# Patient Record
Sex: Male | Born: 1955 | Race: White | Hispanic: No | Marital: Single | Smoking: Current every day smoker
Health system: Southern US, Community
[De-identification: ages and names within clinical notes are randomized; demographics above are authoritative.]

## PROBLEM LIST (undated history)

## (undated) DIAGNOSIS — F419 Anxiety disorder, unspecified: Secondary | ICD-10-CM

## (undated) DIAGNOSIS — F32A Depression, unspecified: Secondary | ICD-10-CM

## (undated) DIAGNOSIS — R17 Unspecified jaundice: Secondary | ICD-10-CM

## (undated) DIAGNOSIS — R4182 Altered mental status, unspecified: Secondary | ICD-10-CM

## (undated) DIAGNOSIS — Z8619 Personal history of other infectious and parasitic diseases: Secondary | ICD-10-CM

## (undated) DIAGNOSIS — F329 Major depressive disorder, single episode, unspecified: Secondary | ICD-10-CM

## (undated) HISTORY — PX: TONSILLECTOMY: SUR1361

---

## 2013-01-09 ENCOUNTER — Emergency Department (HOSPITAL_COMMUNITY)
Admission: EM | Admit: 2013-01-09 | Discharge: 2013-01-09 | Disposition: A | Discharge status: Home / Self Care | Payer: Medicaid Other | Attending: Emergency Medicine | Admitting: Emergency Medicine

## 2013-01-09 ENCOUNTER — Emergency Department (HOSPITAL_COMMUNITY): Payer: Medicaid Other

## 2013-01-09 ENCOUNTER — Encounter (HOSPITAL_COMMUNITY): Payer: Self-pay | Admitting: Emergency Medicine

## 2013-01-09 DIAGNOSIS — M2560 Stiffness of unspecified joint, not elsewhere classified: Secondary | ICD-10-CM | POA: Insufficient documentation

## 2013-01-09 DIAGNOSIS — R4182 Altered mental status, unspecified: Secondary | ICD-10-CM | POA: Insufficient documentation

## 2013-01-09 DIAGNOSIS — H938X9 Other specified disorders of ear, unspecified ear: Secondary | ICD-10-CM | POA: Insufficient documentation

## 2013-01-09 LAB — COMPREHENSIVE METABOLIC PANEL
Albumin: 4.1 g/dL (ref 3.5–5.2)
Alkaline Phosphatase: 67 U/L (ref 39–117)
BUN: 3 mg/dL — ABNORMAL LOW (ref 6–23)
Calcium: 9.9 mg/dL (ref 8.4–10.5)
GFR calc Af Amer: >90 mL/min (ref >90)
GFR calc non Af Amer: >90 mL/min (ref >90)
Glucose, Bld: 103 mg/dL — ABNORMAL HIGH (ref 70–99)
Potassium: 4.2 mEq/L (ref 3.5–5.1)
Sodium: 135 mEq/L (ref 135–145)
Total Protein: 7.6 g/dL (ref 6.0–8.3)

## 2013-01-09 LAB — CBC WITH DIFFERENTIAL/PLATELET
Basophils Relative: 1 % (ref 0–1)
Eosinophils Absolute: 0.2 10*3/uL (ref 0.0–0.7)
MCH: 31.9 pg (ref 26.0–34.0)
MCHC: 35.8 g/dL (ref 30.0–36.0)
Monocytes Relative: 5 % (ref 3–12)
Neutro Abs: 8 10*3/uL — ABNORMAL HIGH (ref 1.7–7.7)
Neutrophils Relative %: 71 % (ref 43–77)
Platelets: 298 10*3/uL (ref 150–400)
RDW: 13.1 % (ref 11.5–15.5)

## 2013-01-09 LAB — RAPID URINE DRUG SCREEN, HOSP PERFORMED
Amphetamines: NOT DETECTED
Benzodiazepines: NOT DETECTED
Opiates: NOT DETECTED
Tetrahydrocannabinol: NOT DETECTED

## 2013-01-09 NOTE — ED Notes (Signed)
Per pt's sister she brings him here today by request of pt's court ordered attorney to be evaluated for why pt has been "not right" the past couple of months. 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  Pt not able to remember things and rambles about nonsense and believes taht he has money in banks all over the area.  Pt not able to perform everyday functions living by himself per pt's sister.

## 2013-01-09 NOTE — ED Provider Notes (Signed)
CSN: [id number]     Arrival date & time 01/09/13  1541 History  This chart was scribed for non-physician practitioner, [name], PA-C working with [name] Crease, MD by Greggory Stallion, ED scribe. This patient was seen in room WTR4/WLPT4 and the patient's care was started at 4:42 PM.   Chief Complaint  Patient presents with  . Medical Clearance   The history is provided by the patient. No language interpreter was used.   HPI Comments: [name] [name] is a 57 y.o. male who presents to the Emergency Department for medical clearance. Pt states he needs a medical evaluation before he goes to court for a DWI. Pt does not have medical evaluation papers from the court. He states he is having popping in his ears and some joint stiffness. Denies any other current complaints. His sister states he has had acute change in memory in the last 2 months in which the pt is unable to carry out his daily activities. His house is foreclosed. His sister is concerned and worried that he is not able to care for himself at home. Pt denies SI/HI.  No past medical history on file. No past surgical history on file. No family history on file. History  Substance Use Topics  . Smoking status: Not on file  . Smokeless tobacco: Not on file  . Alcohol Use: Not on file    Review of Systems  Musculoskeletal: Positive for arthralgias (stiffness).  Psychiatric/Behavioral: Negative for suicidal ideas.    Allergies  Review of patient's allergies indicates not on file.  Home Medications  No current outpatient prescriptions on file.  There were no vitals taken for this visit.  Physical Exam  Nursing note and vitals reviewed. Constitutional: He is oriented to person, place, and time. He appears well-developed and well-nourished. No distress.  HENT:  Head: Normocephalic and atraumatic.  Eyes: EOM are normal.  Neck: Neck supple. No tracheal deviation present.  Cardiovascular: Normal rate.   Pulmonary/Chest:  Effort normal. No respiratory distress.  Musculoskeletal: Normal range of motion.  Neurological: He is alert and oriented to person, place, and time.  No focal neuro deficit on exam.   Skin: Skin is warm and dry.  Psychiatric: He has a normal mood and affect. His speech is normal and behavior is normal. Thought content is not paranoid. Cognition and memory are impaired. He expresses no homicidal and no suicidal ideation. He exhibits abnormal recent memory.  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    COORDINATION OF CARE: 4:58 PM-Repeatedly discussed with pt and his sister there is not an acute need for a head CT. After repetitive discussions, will order head CT for pt.   7:04 PM Sister brought pt to ER because he has been acting different from when he was 2 months ago. He is forgetful, repeating sentences and having decline in his memory.  NO prior hx of stroke. Is a social drinker but does not drink or uses rec drug according to sis.  She is traveling from [place] in hope of finding help for him.  He does not have a PCP.  Currently denies SI/HI or hallucination.  He is alert and oriented x 3.  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  Deterioration has been ongoing, nothing acute today.  Doubt acute stroke.  Head CT without acute finding. Since pt is a trucker, i worry of possible syphilis infection causing AMS.  I did offer to provide medical clearance with labs but subsequently pt and sister request to be discharge.  I offer a list of resources and encourage to find primary care for further evaluation.  Pt otherwise stable for discharge.    Labs Review Labs Reviewed  CBC WITH DIFFERENTIAL  COMPREHENSIVE METABOLIC PANEL  URINE RAPID  DRUG SCREEN (HOSP PERFORMED)  ETHANOL  RPR   Imaging Review Ct Head Wo Contrast  01/09/2013   CLINICAL DATA:  Memory loss past 2 months.  EXAM: CT HEAD WITHOUT CONTRAST  TECHNIQUE: Contiguous axial images were obtained from the base of the skull through the vertex without contrast.  COMPARISON:  None  FINDINGS: Premature for age cerebral and cerebellar atrophy. Chronic microvascular ischemic change in the periventricular and subcortical white matter. No evidence for acute infarction, hemorrhage, mass lesion, hydrocephalus, or extra-axial fluid. Calvarium intact. There appears to have been an old inferior blowout fracture of the right orbit which is incompletely evaluated on this exam.  IMPRESSION: No acute intracranial abnormality. Premature for age atrophy and white matter disease. Suspect old orbital trauma on the right.   Electronically Signed   By: Davonna Belling M.D.   On: 01/09/2013 17:34    EKG Interpretation   None       MDM   1. Altered mental status    BP 136/94  Pulse 93  Temp(Src) 98.7 F (37.1 C) (Oral)  Resp 16  SpO2 99%    I personally performed the services described in this documentation, which was scribed in my presence. The recorded information has been reviewed and is accurate.    [name], PA-C 01/09/13 1911

## 2013-01-09 NOTE — ED Provider Notes (Signed)
Medical screening examination/treatment/procedure(s) were performed by non-physician practitioner and as supervising physician I was immediately available for consultation/collaboration.  EKG Interpretation   None         [name], MD 01/09/13 [phone]

## 2013-01-10 LAB — RPR: RPR Ser Ql: NONREACTIVE

## 2013-05-14 ENCOUNTER — Telehealth: Payer: Self-pay | Admitting: *Deleted

## 2013-05-14 NOTE — Telephone Encounter (Signed)
Sister reported that patient has been experiencing Altered Mental Status for the past year. CSW encouraged sister to [facility]. Sister stated that she would and follow up for further care as needed with [facility].  Beverly Sessionsywan J Micah Galeno MSW, LCSW

## 2013-05-18 ENCOUNTER — Emergency Department (HOSPITAL_COMMUNITY): Payer: Medicaid Other

## 2013-05-18 ENCOUNTER — Inpatient Hospital Stay (HOSPITAL_COMMUNITY)
Admission: EM | Admit: 2013-05-18 | Discharge: 2013-05-19 | DRG: 640 | Disposition: A | Discharge status: Skilled Nursing Facility | Payer: Medicaid Other | Attending: Internal Medicine | Admitting: Internal Medicine

## 2013-05-18 DIAGNOSIS — Z79899 Other long term (current) drug therapy: Secondary | ICD-10-CM | POA: Diagnosis not present

## 2013-05-18 DIAGNOSIS — I998 Other disorder of circulatory system: Secondary | ICD-10-CM | POA: Diagnosis not present

## 2013-05-18 DIAGNOSIS — E876 Hypokalemia: Secondary | ICD-10-CM | POA: Diagnosis present

## 2013-05-18 DIAGNOSIS — E871 Hypo-osmolality and hyponatremia: Principal | ICD-10-CM | POA: Diagnosis present

## 2013-05-18 DIAGNOSIS — D72829 Elevated white blood cell count, unspecified: Secondary | ICD-10-CM | POA: Diagnosis present

## 2013-05-18 DIAGNOSIS — F17200 Nicotine dependence, unspecified, uncomplicated: Secondary | ICD-10-CM | POA: Diagnosis present

## 2013-05-18 DIAGNOSIS — I498 Other specified cardiac arrhythmias: Secondary | ICD-10-CM | POA: Diagnosis present

## 2013-05-18 DIAGNOSIS — G9340 Encephalopathy, unspecified: Secondary | ICD-10-CM | POA: Diagnosis present

## 2013-05-18 DIAGNOSIS — R4182 Altered mental status, unspecified: Secondary | ICD-10-CM

## 2013-05-18 DIAGNOSIS — R579 Shock, unspecified: Secondary | ICD-10-CM | POA: Diagnosis present

## 2013-05-18 DIAGNOSIS — I808 Phlebitis and thrombophlebitis of other sites: Secondary | ICD-10-CM | POA: Diagnosis not present

## 2013-05-18 DIAGNOSIS — Y849 Medical procedure, unspecified as the cause of abnormal reaction of the patient, or of later complication, without mention of misadventure at the time of the procedure: Secondary | ICD-10-CM | POA: Diagnosis not present

## 2013-05-18 DIAGNOSIS — M6282 Rhabdomyolysis: Secondary | ICD-10-CM | POA: Diagnosis present

## 2013-05-18 LAB — CBC
HCT: 36.6 % — ABNORMAL LOW (ref 39.0–52.0)
Hemoglobin: 14 g/dL (ref 13.0–17.0)
MCH: 31.4 pg (ref 26.0–34.0)
MCHC: 38.3 g/dL — ABNORMAL HIGH (ref 30.0–36.0)
MCV: 82.1 fL (ref 78.0–100.0)
Platelets: 361 10*3/uL (ref 150–400)
RBC: 4.46 MIL/uL (ref 4.22–5.81)
RDW: 12.2 % (ref 11.5–15.5)
WBC: 17.2 10*3/uL — ABNORMAL HIGH (ref 4.0–10.5)

## 2013-05-18 LAB — COMPREHENSIVE METABOLIC PANEL
ALBUMIN: 3.4 g/dL — AB (ref 3.5–5.2)
ALT: 18 U/L (ref 0–53)
AST: 59 U/L — ABNORMAL HIGH (ref 0–37)
Alkaline Phosphatase: 66 U/L (ref 39–117)
BUN: 3 mg/dL — ABNORMAL LOW (ref 6–23)
CALCIUM: 8.7 mg/dL (ref 8.4–10.5)
CO2: 22 mEq/L (ref 19–32)
CREATININE: 0.48 mg/dL — AB (ref 0.50–1.35)
Chloride: 72 mEq/L — ABNORMAL LOW (ref 96–112)
GFR calc Af Amer: >90 mL/min (ref >90)
GFR calc non Af Amer: >90 mL/min (ref >90)
Glucose, Bld: 84 mg/dL (ref 70–99)
Potassium: 3.9 mEq/L (ref 3.7–5.3)
Sodium: 108 mEq/L — CL (ref 137–147)
Total Bilirubin: 0.8 mg/dL (ref 0.3–1.2)
Total Protein: 6.4 g/dL (ref 6.0–8.3)

## 2013-05-18 LAB — CBG MONITORING, ED: Glucose-Capillary: 103 mg/dL — ABNORMAL HIGH (ref 70–99)

## 2013-05-18 LAB — BASIC METABOLIC PANEL WITH GFR
BUN: 3 mg/dL — ABNORMAL LOW (ref 6–23)
CO2: 22 meq/L (ref 19–32)
Calcium: 8.6 mg/dL (ref 8.4–10.5)
Chloride: 83 meq/L — ABNORMAL LOW (ref 96–112)
Creatinine, Ser: 0.53 mg/dL (ref 0.50–1.35)
GFR calc Af Amer: >90 mL/min
GFR calc non Af Amer: >90 mL/min
Glucose, Bld: 93 mg/dL (ref 70–99)
Potassium: 4.2 meq/L (ref 3.7–5.3)
Sodium: 119 meq/L — CL (ref 137–147)

## 2013-05-18 LAB — URINALYSIS, ROUTINE W REFLEX MICROSCOPIC
Bilirubin Urine: NEGATIVE
GLUCOSE, UA: NEGATIVE mg/dL
HGB URINE DIPSTICK: NEGATIVE
Ketones, ur: NEGATIVE mg/dL
Leukocytes, UA: NEGATIVE
Nitrite: NEGATIVE
Protein, ur: NEGATIVE mg/dL
Specific Gravity, Urine: 1.004 — ABNORMAL LOW (ref 1.005–1.030)
Urobilinogen, UA: 0.2 mg/dL (ref 0.0–1.0)
pH: 7 (ref 5.0–8.0)

## 2013-05-18 LAB — GLUCOSE, CAPILLARY: Glucose-Capillary: 100 mg/dL — ABNORMAL HIGH (ref 70–99)

## 2013-05-18 LAB — SODIUM, URINE, RANDOM: Sodium, Ur: <20 mEq/L

## 2013-05-18 LAB — BASIC METABOLIC PANEL
BUN: 3 mg/dL — ABNORMAL LOW (ref 6–23)
CO2: 25 meq/L (ref 19–32)
CREATININE: 0.54 mg/dL (ref 0.50–1.35)
Calcium: 9.2 mg/dL (ref 8.4–10.5)
Chloride: 73 mEq/L — ABNORMAL LOW (ref 96–112)
GFR calc Af Amer: >90 mL/min (ref >90)
GLUCOSE: 99 mg/dL (ref 70–99)
Potassium: 4.3 mEq/L (ref 3.7–5.3)
Sodium: 113 mEq/L — CL (ref 137–147)

## 2013-05-18 LAB — RPR

## 2013-05-18 LAB — RAPID URINE DRUG SCREEN, HOSP PERFORMED
Amphetamines: NOT DETECTED
BENZODIAZEPINES: POSITIVE — AB
Barbiturates: NOT DETECTED
COCAINE: NOT DETECTED
Opiates: NOT DETECTED
TETRAHYDROCANNABINOL: NOT DETECTED

## 2013-05-18 LAB — PHOSPHORUS: PHOSPHORUS: 2.7 mg/dL (ref 2.3–4.6)

## 2013-05-18 LAB — MAGNESIUM: MAGNESIUM: 1.5 mg/dL (ref 1.5–2.5)

## 2013-05-18 LAB — PROCALCITONIN: Procalcitonin: <0.1 ng/mL

## 2013-05-18 LAB — TSH: TSH: 0.934 u[IU]/mL (ref 0.350–4.500)

## 2013-05-18 LAB — LACTIC ACID, PLASMA
LACTIC ACID, VENOUS: 4.3 mmol/L — AB (ref 0.5–2.2)
LACTIC ACID, VENOUS: 2.1 mmol/L (ref 0.5–2.2)

## 2013-05-18 LAB — CK: CK TOTAL: 2052 U/L — AB (ref 7–232)

## 2013-05-18 LAB — ETHANOL: Alcohol, Ethyl (B): <11 mg/dL (ref 0–11)

## 2013-05-18 LAB — TROPONIN I: Troponin I: <0.3 ng/mL (ref <0.30)

## 2013-05-18 LAB — OSMOLALITY, URINE: Osmolality, Ur: 30 mOsm/kg — ABNORMAL LOW (ref 390–1090)

## 2013-05-18 MED ORDER — SODIUM CHLORIDE 0.9 % IV SOLN
250.0000 mL | INTRAVENOUS | Status: DC | PRN
Start: 2013-05-18 — End: 2013-05-19

## 2013-05-18 MED ORDER — SODIUM CHLORIDE 0.9 % IV BOLUS (SEPSIS)
1000.0000 mL | Freq: Once | INTRAVENOUS | Status: DC
  Administered 2013-05-18: 1000 mL via INTRAVENOUS

## 2013-05-18 MED ORDER — TETANUS-DIPHTH-ACELL PERTUSSIS 5-2.5-18.5 LF-MCG/0.5 IM SUSP
0.5000 mL | Freq: Once | INTRAMUSCULAR | Status: DC
  Administered 2013-05-18: 0.5 mL via INTRAMUSCULAR
  Filled 2013-05-18: qty 0.5

## 2013-05-18 MED ORDER — DEXTROSE 5 % IV SOLN
INTRAVENOUS | Status: DC
  Administered 2013-05-18: 1000 mL via INTRAVENOUS

## 2013-05-18 MED ORDER — THIAMINE HCL 100 MG/ML IJ SOLN
100.0000 mg | Freq: Once | INTRAMUSCULAR | Status: DC
  Administered 2013-05-18: 100 mg via INTRAVENOUS
  Filled 2013-05-18: qty 2

## 2013-05-18 MED ORDER — ASPIRIN 81 MG PO CHEW: 324.0000 mg | CHEWABLE_TABLET | ORAL | Status: DC

## 2013-05-18 MED ORDER — LORAZEPAM 2 MG/ML IJ SOLN
0.5000 mg | INTRAMUSCULAR | Status: DC | PRN
  Administered 2013-05-18 – 2013-05-19 (×2): 2 mg via INTRAVENOUS
  Filled 2013-05-18: qty 1

## 2013-05-18 MED ORDER — ASPIRIN 300 MG RE SUPP
300.0000 mg | RECTAL | Status: DC
  Administered 2013-05-18: 300 mg via RECTAL
  Filled 2013-05-18: qty 1

## 2013-05-18 MED ORDER — LORAZEPAM 2 MG/ML IJ SOLN
INTRAMUSCULAR | Status: AC
  Administered 2013-05-18: 2 mg via INTRAVENOUS
  Filled 2013-05-18: qty 1

## 2013-05-18 MED ORDER — PHENYLEPHRINE HCL 10 MG/ML IJ SOLN
30.0000 ug/min | INTRAVENOUS | Status: DC
  Administered 2013-05-18: 30 ug/min via INTRAVENOUS
  Administered 2013-05-19: 70 ug/min via INTRAVENOUS
  Administered 2013-05-19: 50 ug/min via INTRAVENOUS
  Filled 2013-05-18 (×3): qty 1

## 2013-05-18 MED ORDER — ENOXAPARIN SODIUM 40 MG/0.4ML ~~LOC~~ SOLN
40.0000 mg | SUBCUTANEOUS | Status: DC
  Administered 2013-05-18: 40 mg via SUBCUTANEOUS
  Filled 2013-05-18 (×2): qty 0.4

## 2013-05-18 MED ORDER — SODIUM CHLORIDE 0.9 % IV SOLN: INTRAVENOUS | Status: DC

## 2013-05-18 MED ORDER — SODIUM CHLORIDE 0.9 % IV SOLN
INTRAVENOUS | Status: DC
  Administered 2013-05-18: 19:00:00 via INTRAVENOUS

## 2013-05-18 MED ORDER — DEXMEDETOMIDINE HCL IN NACL 200 MCG/50ML IV SOLN
0.4000 ug/kg/h | INTRAVENOUS | Status: DC
  Administered 2013-05-18: 1.2 ug/kg/h via INTRAVENOUS
  Administered 2013-05-18: 0.6 ug/kg/h via INTRAVENOUS
  Administered 2013-05-18: 0.8 ug/kg/h via INTRAVENOUS
  Administered 2013-05-19: 1.2 ug/kg/h via INTRAVENOUS
  Administered 2013-05-19: 0.4 ug/kg/h via INTRAVENOUS
  Filled 2013-05-18 (×4): qty 50

## 2013-05-18 NOTE — ED Provider Notes (Addendum)
CSN: [id number]     Arrival date & time 05/18/13  1408 History   First MD Initiated Contact with Patient 05/18/13 1409     Chief Complaint  Patient presents with  . Altered Mental Status     (Consider location/radiation/quality/duration/timing/severity/associated sxs/prior Treatment) Patient is a 58 y.o. male presenting with altered mental status. The history is provided by the patient and the EMS personnel. The history is limited by the condition of the patient.  Altered Mental Status pt w unknown hx found lying in yard today by mail carrier.  Pt noted w incomprehensible speech and altered mental status, EMS was called.  EMS placed on back board and ccollar precautions, at which point pt became agitated, combative, thrashing about, moving bil extremities.  EMS indicates restrained as best they could and gave patient a total of 7 mg versed in route to ED.  Pt arrives to ED w markedly altered mental status, sedated - level 5 caveat.      Past Medical History  Diagnosis Date  . Pneumothorax 2002; 05/10/2013    left; right ("both required chest tubes")  . GERD (gastroesophageal reflux disease)   . [id number](478.2)     "couple times/month" (05/11/2013)   Past Surgical History  Procedure Laterality Date  . Tonsillectomy      "when I was a kid; told they grew back"  . Inguinal hernia repair Bilateral 1990's  . Wrist fracture surgery Left 1980's   No family history on file. History  Substance Use Topics  . Smoking status: Current Every Day Smoker -- 2.00 packs/day for 33 years    Types: Cigarettes  . Smokeless tobacco: Former Neurosurgeon    Types: Chew     Comment: "chewed in my teens"  . Alcohol Use: 7.2 oz/week    12 Shots of liquor per week     Comment: 05/11/2013 "1-2 mixed drinkscouple nights/week; 2-4 mixed drinks on the weekend days"    Review of Systems  Unable to perform ROS: Mental status change  level 5 caveat - altered mental status      Allergies  Review of patient's  allergies indicates no known allergies.  Home Medications   Prior to Admission medications   Medication Sig Start Date End Date Taking? Authorizing Provider  Aspirin-Salicylamide-Caffeine (BC HEADACHE PO) Take 1 packet by mouth as needed (pain / headache).   Yes Historical Provider, MD  benzonatate (TESSALON) 100 MG capsule Take 100 mg by mouth every 8 (eight) hours as needed for cough.   Yes Historical Provider, MD  levofloxacin (LEVAQUIN) 500 MG tablet Take 500 mg by mouth daily. Take for 10 days patient was unable to tell me when he started medication. Only 3 pills left in bottle as of 05-18-13.   Yes Historical Provider, MD   BP 129/50  Pulse 69  Temp(Src) 99.2 F (37.3 C) (Axillary)  Resp 25  SpO2 99% Physical Exam  Nursing note and vitals reviewed. Constitutional:  Pt sedated/obtunded. Disheveled. Not verbally responsive. Multiple abrasions to extremities.   HENT:  Mouth/Throat: Oropharynx is clear and moist.  Eyes: Conjunctivae are normal. Pupils are equal, round, and reactive to light. No scleral icterus.  Neck: Neck supple. No tracheal deviation present. No thyromegaly present.  No bruit.  Ccollar precautions.     Cardiovascular: Normal rate, regular rhythm, normal heart sounds and intact distal pulses.  Exam reveals no gallop and no friction rub.   No murmur heard. Pulmonary/Chest: Effort normal and breath sounds normal. No accessory muscle usage. No  respiratory distress.  Abdominal: Soft. Bowel sounds are normal. He exhibits no distension and no mass. There is no tenderness. There is no rebound and no guarding.  Genitourinary:  No cva tenderness. Normal ext gen.   Musculoskeletal: He exhibits no edema.  Good rom bil ext without apparent pain, no focal deformity noted.  Abrasions bil knees and hands. Distal pulses palp.  CTLS spine, non tender, aligned, no step off.   Neurological:  Pt opens eyes to command. Per ems, pt making purposefully movement bil extremities pta.   Does not follow commands. Makes incomprehensible sounds.   Skin: Skin is warm and dry. No rash noted.  Psychiatric:  Pt w markedly altered mental status, sedated.     ED Course  Procedures (including critical care time)   Results for orders placed during the hospital encounter of 05/18/13  CBC      Result Value Ref Range   WBC 17.2 (*) 4.0 - 10.5 K/uL   RBC 4.46  4.22 - 5.81 MIL/uL   Hemoglobin 14.0  13.0 - 17.0 g/dL   HCT 16.136.6 (*) 09.639.0 - 04.552.0 %   MCV 82.1  78.0 - 100.0 fL   MCH 31.4  26.0 - 34.0 pg   MCHC 38.3 (*) 30.0 - 36.0 g/dL   RDW 40.912.2  81.111.5 - 91.415.5 %   Platelets 361  150 - 400 K/uL  COMPREHENSIVE METABOLIC PANEL      Result Value Ref Range   Sodium 108 (*) 137 - 147 mEq/L   Potassium 3.9  3.7 - 5.3 mEq/L   Chloride 72 (*) 96 - 112 mEq/L   CO2 22  19 - 32 mEq/L   Glucose, Bld 84  70 - 99 mg/dL   BUN 3 (*) 6 - 23 mg/dL   Creatinine, Ser 7.820.48 (*) 0.50 - 1.35 mg/dL   Calcium 8.7  8.4 - 95.610.5 mg/dL   Total Protein 6.4  6.0 - 8.3 g/dL   Albumin 3.4 (*) 3.5 - 5.2 g/dL   AST 59 (*) 0 - 37 U/L   ALT 18  0 - 53 U/L   Alkaline Phosphatase 66  39 - 117 U/L   Total Bilirubin 0.8  0.3 - 1.2 mg/dL   GFR calc non Af Amer >90  >90 mL/min   GFR calc Af Amer >90  >90 mL/min  ETHANOL      Result Value Ref Range   Alcohol, Ethyl (B) <11  0 - 11 mg/dL  TROPONIN I      Result Value Ref Range   Troponin I <0.30  <0.30 ng/mL  CK      Result Value Ref Range   Total CK 2052 (*) 7 - 232 U/L  CBG MONITORING, ED      Result Value Ref Range   Glucose-Capillary 103 (*) 70 - 99 mg/dL   Dg Chest 1 View  2/13/08654/20/2015   CLINICAL DATA:  Altered mental status.  Unresponsive patient.  EXAM: CHEST - 1 VIEW  COMPARISON:  DG CHEST 2 VIEW dated 05/13/2013  FINDINGS: Inferior half of the chest is excluded from view. The patient was noncompliant with imaging.  The cardiopericardial silhouette and mediastinal contours appear within normal limits. No airspace disease or effusion. No pneumothorax  identified. Monitoring leads project over the chest.  IMPRESSION: 1. No visible acute cardiopulmonary disease. 2. Lung bases are cut off due to patient non compliance with imaging.   Electronically Signed   By: Andreas NewportGeoffrey  Lamke M.D.   On: 05/18/2013  15:15   Dg Chest 2 View  05/13/2013   CLINICAL DATA:  Right chest pain  EXAM: CHEST  2 VIEW  COMPARISON:  05/12/2013  FINDINGS: Cardiac shadow is stable. The lungs are well-aerated without evidence of recurrent pneumothorax. Mild left basilar atelectasis is seen. No acute bony abnormality is noted.  IMPRESSION: Left basilar changes  No recurrent pneumothorax.   Electronically Signed   By: Alcide CleverMark  Lukens M.D.   On: 05/13/2013 07:59   Dg Chest Port 1 View  05/12/2013   CLINICAL DATA:  Postop.  Check chest tubes.  EXAM: PORTABLE CHEST - 1 VIEW  COMPARISON:  05/12/2013  FINDINGS: Right-sided chest tube has been removed. No pneumothorax identified. There is bibasilar atelectasis. Mild perihilar atelectasis. Heart size is normal.  IMPRESSION: No pneumothorax following chest tube removal.   Electronically Signed   By: Rosalie GumsBeth  Brown M.D.   On: 05/12/2013 15:39   Dg Chest Port 1 View  05/12/2013   CLINICAL DATA:  Check chest tube position  EXAM: PORTABLE CHEST - 1 VIEW  COMPARISON:  05/11/2013  FINDINGS: Cardiac shadow is stable. The lungs remain well aerated. No pneumothorax is identified. A right-sided chest tube is again seen and has withdrawn slightly in the interval although the and hole and proximal side hole lie within the chest cavity. No new focal abnormality is seen.  IMPRESSION: Slight withdrawal of right-sided chest tube although no significant interval change is noted.   Electronically Signed   By: Alcide CleverMark  Lukens M.D.   On: 05/12/2013 07:38   Portable Chest 1 View  05/11/2013   CLINICAL DATA:  Followup chest tube.  Chest discomfort.  EXAM: PORTABLE CHEST - 1 VIEW  COMPARISON:  05/10/2013  FINDINGS: Cardiac silhouette is normal size. Normal mediastinal and hilar  contours.  There is bilateral irregular interstitial thickening that is most evident in the left lung base. No areas of consolidation.  Right chest tube is stable. No pneumothorax. There is no convincing pleural effusion.  IMPRESSION: 1. No convincing residual pneumothorax.  Stable right chest tube. 2. Irregularly thickened interstitial markings, without significant change allowing for differences in lung volume and technique. No convincing edema. There is no focal consolidation.   Electronically Signed   By: Amie Portlandavid  Ormond M.D.   On: 05/11/2013 08:10   Dg Chest Port 1 View  05/10/2013   CLINICAL DATA:  Postop, check chest tube  EXAM: PORTABLE CHEST - 1 VIEW  COMPARISON:  DG CHEST 1V PORT dated 05/10/2013; DG CHEST 2 VIEW dated 03/31/2009  FINDINGS: Right chest tube has been placed. There is significant improvement with only a trace 3 mm residual pneumothorax. Heart size is normal. Lungs are clear.  IMPRESSION: Only trace residual pneumothorax identified on the right status post chest tube placement.   Electronically Signed   By: Esperanza Heiraymond  Rubner M.D.   On: 05/10/2013 20:22   Dg Chest Portable 1 View  05/10/2013   CLINICAL DATA:  Shortness of breath today  EXAM: PORTABLE CHEST - 1 VIEW  COMPARISON:  DG CHEST 2 VIEW dated 03/31/2009  FINDINGS: There is a large right-sided pneumothorax. The degree of collapse is nearly 80%. There is no significant mediastinal shift. The left lung is well expanded. The cardiac silhouette is normal in size. There is no pleural effusion. The observed portions of the bony thorax appear normal.  IMPRESSION: There is a large right-sided pneumothorax without significant mediastinal shift. These results were called by telephone at the time of interpretation on 05/10/2013 at 6:32 PM  to Dr. Nelva Nay , who verbally acknowledged these results.   Electronically Signed   By: David  [place]   On: 05/10/2013 18:32       EKG Interpretation   Date/Time:  Monday May 18 2013 14:13:53  EDT Ventricular Rate:  66 PR Interval:  153 QRS Duration: 99 QT Interval:  526 QTC Calculation: 551 R Axis:   83 Text Interpretation:  Sinus rhythm ST elev, probable normal early repol  pattern Prolonged QT interval Baseline wander in lead(s) V5 No previous  tracing Confirmed by Denton Lank  MD, Caryn Bee ([id number]) on 05/18/2013 2:32:06 PM      MDM  Iv ns. Continuous pulse ox and monitor.   Labs. Reviewed nursing notes and prior charts for additional history.   Cxr. Cts.   Na+ returns very low, NS bolus, BMET redrawn to verify.  Med service contacted for admission.  Recheck pt, more responsive/alert than prior, still not verbally responsive/answering questions, is utterly few word sentences, v difficult to understand. Pt moving bil extremities purposefully. Clears throat//swallows/protecting airway. No resp depression.  cts pending.   Contacted [facility]ad hospitalist for admission, discussed pt with Dr Waymon Amato - he requests waiting for repeat BMET, and if Na+ 108 requests admit to ICU.    1630 repeat Na+/BMET pending, UA pending, CTs pending - signed out to Dr Patria Mane to check these results, reassess patient and dispo appropriately.   CRITICAL CARE RE markedly altered mental status, severe hyponatremia Performed by: Suzi Roots Total critical care time: 45 Critical care time was exclusive of separately billable procedures and treating other patients. Critical care was necessary to treat or prevent imminent or life-threatening deterioration. Critical care was time spent personally by me on the following activities: development of treatment plan with patient and/or surrogate as well as nursing, discussions with consultants, evaluation of patient's response to treatment, examination of patient, obtaining history from patient or surrogate, ordering and performing treatments and interventions, ordering and review of laboratory studies, ordering and review of radiographic studies, pulse oximetry and  re-evaluation of patient's condition.       Suzi Roots, MD 05/18/13 1646  Suzi Roots, MD 07/17/13 [phone]

## 2013-05-18 NOTE — ED Provider Notes (Addendum)
Repeat sodium still low on 13.  Continue IV hydration.  Will admit to the intensive care unit.  Continues to be significantly altered.  Oral temperature is 99.2.  Will obtain a rectal temperature.  Blood cultures added now.  Lactate ordered now.  I suppose the tourniquet had a seizure from his hyponatremia and when EMS found him and he was combative this could have been a postictal state.  The patient received Versed.  His had no seizures in the emergency department.  Patient be admitted to the intensive care unit.  I discussed the case with Dr. Patrick Wright of [facility]  CRITICAL CAREShan Levans Performed by: Lyanne CoKevin M Jennel Mara Total critical care time: 32 Critical care time was exclusive of separately billable procedures and treating other patients. Critical care was necessary to treat or prevent imminent or life-threatening deterioration. Critical care was time spent personally by me on the following activities: development of treatment plan with patient and/or surrogate as well as nursing, discussions with consultants, evaluation of patient's response to treatment, examination of patient, obtaining history from patient or surrogate, ordering and performing treatments and interventions, ordering and review of laboratory studies, ordering and review of radiographic studies, pulse oximetry and re-evaluation of patient's condition.   Dg Chest 1 View  05/18/2013   CLINICAL DATA:  Altered mental status.  Unresponsive patient.  EXAM: CHEST - 1 VIEW  COMPARISON:  DG CHEST 2 VIEW dated 05/13/2013  FINDINGS: Inferior half of the chest is excluded from view. The patient was noncompliant with imaging.  The cardiopericardial silhouette and mediastinal contours appear within normal limits. No airspace disease or effusion. No pneumothorax identified. Monitoring leads project over the chest.  IMPRESSION: 1. No visible acute cardiopulmonary disease. 2. Lung bases are cut off due to patient non compliance with imaging.   Electronically  Signed   By: Andreas NewportGeoffrey  Lamke M.D.   On: 05/18/2013 15:15   Dg Chest 2 View  05/13/2013   CLINICAL DATA:  Right chest pain  EXAM: CHEST  2 VIEW  COMPARISON:  05/12/2013  FINDINGS: Cardiac shadow is stable. The lungs are well-aerated without evidence of recurrent pneumothorax. Mild left basilar atelectasis is seen. No acute bony abnormality is noted.  IMPRESSION: Left basilar changes  No recurrent pneumothorax.   Electronically Signed   By: Alcide CleverMark  Lukens M.D.   On: 05/13/2013 07:59   Ct Head Wo Contrast  05/18/2013   CLINICAL DATA:  Fall, with pain and injury.  Neck pain.  Head pain.  EXAM: CT HEAD WITHOUT CONTRAST  CT CERVICAL SPINE WITHOUT CONTRAST  TECHNIQUE: Multidetector CT imaging of the head and cervical spine was performed following the standard protocol without intravenous contrast. Multiplanar CT image reconstructions of the cervical spine were also generated.  COMPARISON:  None.  FINDINGS: CT HEAD FINDINGS  No evidence for acute infarction, hemorrhage, mass lesion, hydrocephalus, or extra-axial fluid. There is mild atrophy. Mild chronic microvascular ischemic change is noted. Mild carotid siphon vascular calcification is noted. Calvarium is intact. There is no acute sinus or mastoid disease.  CT CERVICAL SPINE FINDINGS  There is no visible cervical spine fracture, traumatic subluxation, prevertebral soft tissue swelling, or intraspinal hematoma. Advanced disc space narrowing at C5-6 and C6-7 with mild straightening of the cervical lordosis. Multilevel facet arthropathy. Bilateral foraminal narrowing at C5-6 and C6-7. Lung apices show mild scarring. Airway midline. Carotid atherosclerosis.  IMPRESSION: Mild atrophy and small vessel disease. No acute intracranial findings.  Cervical spondylosis.  No fracture or traumatic subluxation.   Electronically  Signed   By: Davonna Belling M.D.   On: 05/18/2013 16:36   Ct Cervical Spine Wo Contrast  05/18/2013   CLINICAL DATA:  Fall, with pain and injury.  Neck  pain.  Head pain.  EXAM: CT HEAD WITHOUT CONTRAST  CT CERVICAL SPINE WITHOUT CONTRAST  TECHNIQUE: Multidetector CT imaging of the head and cervical spine was performed following the standard protocol without intravenous contrast. Multiplanar CT image reconstructions of the cervical spine were also generated.  COMPARISON:  None.  FINDINGS: CT HEAD FINDINGS  No evidence for acute infarction, hemorrhage, mass lesion, hydrocephalus, or extra-axial fluid. There is mild atrophy. Mild chronic microvascular ischemic change is noted. Mild carotid siphon vascular calcification is noted. Calvarium is intact. There is no acute sinus or mastoid disease.  CT CERVICAL SPINE FINDINGS  There is no visible cervical spine fracture, traumatic subluxation, prevertebral soft tissue swelling, or intraspinal hematoma. Advanced disc space narrowing at C5-6 and C6-7 with mild straightening of the cervical lordosis. Multilevel facet arthropathy. Bilateral foraminal narrowing at C5-6 and C6-7. Lung apices show mild scarring. Airway midline. Carotid atherosclerosis.  IMPRESSION: Mild atrophy and small vessel disease. No acute intracranial findings.  Cervical spondylosis.  No fracture or traumatic subluxation.   Electronically Signed   By: Davonna Belling M.D.   On: 05/18/2013 16:36   Dg Chest Port 1 View  05/12/2013   CLINICAL DATA:  Postop.  Check chest tubes.  EXAM: PORTABLE CHEST - 1 VIEW  COMPARISON:  05/12/2013  FINDINGS: Right-sided chest tube has been removed. No pneumothorax identified. There is bibasilar atelectasis. Mild perihilar atelectasis. Heart size is normal.  IMPRESSION: No pneumothorax following chest tube removal.   Electronically Signed   By: Rosalie Gums M.D.   On: 05/12/2013 15:39   Dg Chest Port 1 View  05/12/2013   CLINICAL DATA:  Check chest tube position  EXAM: PORTABLE CHEST - 1 VIEW  COMPARISON:  05/11/2013  FINDINGS: Cardiac shadow is stable. The lungs remain well aerated. No pneumothorax is identified. A  right-sided chest tube is again seen and has withdrawn slightly in the interval although the and hole and proximal side hole lie within the chest cavity. No new focal abnormality is seen.  IMPRESSION: Slight withdrawal of right-sided chest tube although no significant interval change is noted.   Electronically Signed   By: Alcide Clever M.D.   On: 05/12/2013 07:38   Portable Chest 1 View  05/11/2013   CLINICAL DATA:  Followup chest tube.  Chest discomfort.  EXAM: PORTABLE CHEST - 1 VIEW  COMPARISON:  05/10/2013  FINDINGS: Cardiac silhouette is normal size. Normal mediastinal and hilar contours.  There is bilateral irregular interstitial thickening that is most evident in the left lung base. No areas of consolidation.  Right chest tube is stable. No pneumothorax. There is no convincing pleural effusion.  IMPRESSION: 1. No convincing residual pneumothorax.  Stable right chest tube. 2. Irregularly thickened interstitial markings, without significant change allowing for differences in lung volume and technique. No convincing edema. There is no focal consolidation.   Electronically Signed   By: Amie Portland M.D.   On: 05/11/2013 08:10   Dg Chest Port 1 View  05/10/2013   CLINICAL DATA:  Postop, check chest tube  EXAM: PORTABLE CHEST - 1 VIEW  COMPARISON:  DG CHEST 1V PORT dated 05/10/2013; DG CHEST 2 VIEW dated 03/31/2009  FINDINGS: Right chest tube has been placed. There is significant improvement with only a trace 3 mm residual pneumothorax. Heart  size is normal. Lungs are clear.  IMPRESSION: Only trace residual pneumothorax identified on the right status post chest tube placement.   Electronically Signed   By: Esperanza Heir M.D.   On: 05/10/2013 20:22   Dg Chest Portable 1 View  05/10/2013   CLINICAL DATA:  Shortness of breath today  EXAM: PORTABLE CHEST - 1 VIEW  COMPARISON:  DG CHEST 2 VIEW dated 03/31/2009  FINDINGS: There is a large right-sided pneumothorax. The degree of collapse is nearly 80%. There is no  significant mediastinal shift. The left lung is well expanded. The cardiac silhouette is normal in size. There is no pleural effusion. The observed portions of the bony thorax appear normal.  IMPRESSION: There is a large right-sided pneumothorax without significant mediastinal shift. These results were called by telephone at the time of interpretation on 05/10/2013 at 6:32 PM to Dr. Nelva Nay , who verbally acknowledged these results.   Electronically Signed   By: David  [place]   On: 05/10/2013 18:32  I personally reviewed the imaging tests through PACS system I reviewed available ER/hospitalization records through the EMR Results for orders placed during the hospital encounter of 05/18/13  CBC      Result Value Ref Range   WBC 17.2 (*) 4.0 - 10.5 K/uL   RBC 4.46  4.22 - 5.81 MIL/uL   Hemoglobin 14.0  13.0 - 17.0 g/dL   HCT 29.5 (*) 62.1 - 30.8 %   MCV 82.1  78.0 - 100.0 fL   MCH 31.4  26.0 - 34.0 pg   MCHC 38.3 (*) 30.0 - 36.0 g/dL   RDW 65.7  84.6 - 96.2 %   Platelets 361  150 - 400 K/uL  COMPREHENSIVE METABOLIC PANEL      Result Value Ref Range   Sodium 108 (*) 137 - 147 mEq/L   Potassium 3.9  3.7 - 5.3 mEq/L   Chloride 72 (*) 96 - 112 mEq/L   CO2 22  19 - 32 mEq/L   Glucose, Bld 84  70 - 99 mg/dL   BUN 3 (*) 6 - 23 mg/dL   Creatinine, Ser 9.52 (*) 0.50 - 1.35 mg/dL   Calcium 8.7  8.4 - 84.1 mg/dL   Total Protein 6.4  6.0 - 8.3 g/dL   Albumin 3.4 (*) 3.5 - 5.2 g/dL   AST 59 (*) 0 - 37 U/L   ALT 18  0 - 53 U/L   Alkaline Phosphatase 66  39 - 117 U/L   Total Bilirubin 0.8  0.3 - 1.2 mg/dL   GFR calc non Af Amer >90  >90 mL/min   GFR calc Af Amer >90  >90 mL/min  ETHANOL      Result Value Ref Range   Alcohol, Ethyl (B) <11  0 - 11 mg/dL  TROPONIN I      Result Value Ref Range   Troponin I <0.30  <0.30 ng/mL  CK      Result Value Ref Range   Total CK 2052 (*) 7 - 232 U/L  BASIC METABOLIC PANEL      Result Value Ref Range   Sodium 113 (*) 137 - 147 mEq/L   Potassium 4.3   3.7 - 5.3 mEq/L   Chloride 73 (*) 96 - 112 mEq/L   CO2 25  19 - 32 mEq/L   Glucose, Bld 99  70 - 99 mg/dL   BUN 3 (*) 6 - 23 mg/dL   Creatinine, Ser 3.24  0.50 - 1.35 mg/dL  Calcium 9.2  8.4 - 10.5 mg/dL   GFR calc non Af Amer >90  >90 mL/min   GFR calc Af Amer >90  >90 mL/min  CBG MONITORING, ED      Result Value Ref Range   Glucose-Capillary 103 (*) 70 - 99 mg/dL     Lyanne CoKevin M Cearra Portnoy, MD 05/18/13 1700  Lyanne CoKevin M Dalanie Kisner, MD 05/18/13 1705

## 2013-05-18 NOTE — ED Notes (Signed)
Patient to radiology at this time.
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
## 2013-05-18 NOTE — Progress Notes (Addendum)
CCM Interim Progress Note.  Critical value of Na 119 received.  Na 108 --> 119 over the last 7h -- rapid overcorrection  Plan:  - D/c NS and start D5W at 50cc/h.  Cont BMET q2h - Discussed with attending, Dr. Marchelle Gearingamaswamy.  Stacy GardnerNeema Edwar Coe, MD (PGY 3, IMTS)  ADDENDUM:  Na 108 --> 113 --> 119 --> 124 (Na increased 16 over 12 hours -- rapid overcorrection)  Per UpToDate, to relower serum sodium: D5W at 716mL/kg over 2 hours (this will be ~250cc/h for this patient) --> this should correct Na by ~322mEq  To note, serum Na should be lowered at an avg of 421mEq/h, and D5W + Desmopressin should be given to lower the sodium to less than 117 meq/L (108+9) within the next 8 hours, so that the net rate of correction over the 24-hour period is less than 9 meq/L.  PLAN: -250cc/h x 2hours then repeat BMET -if Na is 122, then repeat 250cc/h x 2h and repeat BMET, goal Na=117 by 2pm -if no response or worsening at 5am bmet, will need to treat with desmopressin 2mcg (may increase to 4mcg if no response) IV q6 h - continuing to check frequent BMET q2h

## 2013-05-18 NOTE — Progress Notes (Signed)
CRITICAL VALUE ALERT  Critical value received:  Na 119  Date of notification:  04/20  Time of notification:  2247  Critical value read back:yes  Nurse who received alert:  A.Canary BrimSperry RN  MD notified (1st page):  Neema MD  Time of first page:  2247  MD notified (2nd page):  Time of second page:  Responding MD:  Anselm LisNeema MD  Time MD responded:  [phone]

## 2013-05-18 NOTE — ED Notes (Signed)
Patient found lying in yard today by mail carrier, patient with incomprehensible speech per EMS and patient became combative with EMS crew during transport, patient received Versed 7 mg total per EMS pta,

## 2013-05-19 ENCOUNTER — Inpatient Hospital Stay (HOSPITAL_COMMUNITY): Payer: Medicaid Other

## 2013-05-19 ENCOUNTER — Inpatient Hospital Stay (HOSPITAL_COMMUNITY)
Admission: EM | Admit: 2013-05-19 | Discharge: 2013-05-25 | Disposition: A | Discharge status: Skilled Nursing Facility | Payer: Medicaid Other | Source: Home / Self Care | Attending: Internal Medicine | Admitting: Internal Medicine

## 2013-05-19 DIAGNOSIS — R579 Shock, unspecified: Secondary | ICD-10-CM

## 2013-05-19 DIAGNOSIS — Z720 Tobacco use: Secondary | ICD-10-CM

## 2013-05-19 DIAGNOSIS — I498 Other specified cardiac arrhythmias: Secondary | ICD-10-CM

## 2013-05-19 DIAGNOSIS — F1010 Alcohol abuse, uncomplicated: Secondary | ICD-10-CM

## 2013-05-19 DIAGNOSIS — I808 Phlebitis and thrombophlebitis of other sites: Secondary | ICD-10-CM | POA: Diagnosis not present

## 2013-05-19 DIAGNOSIS — G9340 Encephalopathy, unspecified: Secondary | ICD-10-CM | POA: Diagnosis present

## 2013-05-19 DIAGNOSIS — E871 Hypo-osmolality and hyponatremia: Secondary | ICD-10-CM | POA: Diagnosis present

## 2013-05-19 DIAGNOSIS — M6282 Rhabdomyolysis: Secondary | ICD-10-CM | POA: Diagnosis present

## 2013-05-19 DIAGNOSIS — R410 Disorientation, unspecified: Secondary | ICD-10-CM

## 2013-05-19 DIAGNOSIS — R4182 Altered mental status, unspecified: Secondary | ICD-10-CM

## 2013-05-19 DIAGNOSIS — D72829 Elevated white blood cell count, unspecified: Secondary | ICD-10-CM | POA: Diagnosis present

## 2013-05-19 DIAGNOSIS — F29 Unspecified psychosis not due to a substance or known physiological condition: Secondary | ICD-10-CM | POA: Diagnosis present

## 2013-05-19 DIAGNOSIS — F17200 Nicotine dependence, unspecified, uncomplicated: Secondary | ICD-10-CM | POA: Diagnosis present

## 2013-05-19 DIAGNOSIS — E876 Hypokalemia: Secondary | ICD-10-CM

## 2013-05-19 DIAGNOSIS — Y849 Medical procedure, unspecified as the cause of abnormal reaction of the patient, or of later complication, without mention of misadventure at the time of the procedure: Secondary | ICD-10-CM | POA: Diagnosis not present

## 2013-05-19 DIAGNOSIS — I998 Other disorder of circulatory system: Secondary | ICD-10-CM

## 2013-05-19 HISTORY — DX: Personal history of other infectious and parasitic diseases: Z86.19

## 2013-05-19 HISTORY — DX: Unspecified jaundice: R17

## 2013-05-19 HISTORY — DX: Major depressive disorder, single episode, unspecified: F32.9

## 2013-05-19 HISTORY — DX: Altered mental status, unspecified: R41.82

## 2013-05-19 HISTORY — DX: Depression, unspecified: F32.A

## 2013-05-19 HISTORY — DX: Anxiety disorder, unspecified: F41.9

## 2013-05-19 LAB — CBC
HCT: 34.9 % — ABNORMAL LOW (ref 39.0–52.0)
HCT: 34.7 % — ABNORMAL LOW (ref 39.0–52.0)
Hemoglobin: 12.9 g/dL — ABNORMAL LOW (ref 13.0–17.0)
Hemoglobin: 12.8 g/dL — ABNORMAL LOW (ref 13.0–17.0)
MCH: 30.7 pg (ref 26.0–34.0)
MCH: 30.8 pg (ref 26.0–34.0)
MCHC: 37 g/dL — ABNORMAL HIGH (ref 30.0–36.0)
MCHC: 36.9 g/dL — ABNORMAL HIGH (ref 30.0–36.0)
MCV: 83.1 fL (ref 78.0–100.0)
MCV: 83.6 fL (ref 78.0–100.0)
PLATELETS: 350 10*3/uL (ref 150–400)
Platelets: 391 10*3/uL (ref 150–400)
RBC: 4.2 MIL/uL — ABNORMAL LOW (ref 4.22–5.81)
RBC: 4.15 MIL/uL — AB (ref 4.22–5.81)
RDW: 12.7 % (ref 11.5–15.5)
RDW: 12.6 % (ref 11.5–15.5)
WBC: 9.5 10*3/uL (ref 4.0–10.5)
WBC: 14.2 10*3/uL — ABNORMAL HIGH (ref 4.0–10.5)

## 2013-05-19 LAB — BASIC METABOLIC PANEL
BUN: 3 mg/dL — AB (ref 6–23)
BUN: 3 mg/dL — ABNORMAL LOW (ref 6–23)
BUN: 3 mg/dL — ABNORMAL LOW (ref 6–23)
BUN: 3 mg/dL — AB (ref 6–23)
BUN: 3 mg/dL — ABNORMAL LOW (ref 6–23)
BUN: 3 mg/dL — ABNORMAL LOW (ref 6–23)
BUN: <3 mg/dL — ABNORMAL LOW (ref 6–23)
CALCIUM: 8.7 mg/dL (ref 8.4–10.5)
CALCIUM: 8.6 mg/dL (ref 8.4–10.5)
CALCIUM: 9 mg/dL (ref 8.4–10.5)
CALCIUM: 8.7 mg/dL (ref 8.4–10.5)
CHLORIDE: 91 meq/L — AB (ref 96–112)
CO2: 23 mEq/L (ref 19–32)
CO2: 23 mEq/L (ref 19–32)
CO2: 25 mEq/L (ref 19–32)
CO2: 25 mEq/L (ref 19–32)
CO2: 24 mEq/L (ref 19–32)
CO2: 24 mEq/L (ref 19–32)
CO2: 25 mEq/L (ref 19–32)
CO2: 24 mEq/L (ref 19–32)
CREATININE: 0.57 mg/dL (ref 0.50–1.35)
CREATININE: 0.56 mg/dL (ref 0.50–1.35)
Calcium: 8.7 mg/dL (ref 8.4–10.5)
Calcium: 8.8 mg/dL (ref 8.4–10.5)
Calcium: 8.6 mg/dL (ref 8.4–10.5)
Calcium: 8.6 mg/dL (ref 8.4–10.5)
Chloride: 87 mEq/L — ABNORMAL LOW (ref 96–112)
Chloride: 90 mEq/L — ABNORMAL LOW (ref 96–112)
Chloride: 91 mEq/L — ABNORMAL LOW (ref 96–112)
Chloride: 91 mEq/L — ABNORMAL LOW (ref 96–112)
Chloride: 93 mEq/L — ABNORMAL LOW (ref 96–112)
Chloride: 93 mEq/L — ABNORMAL LOW (ref 96–112)
Chloride: 92 mEq/L — ABNORMAL LOW (ref 96–112)
Creatinine, Ser: 0.52 mg/dL (ref 0.50–1.35)
Creatinine, Ser: 0.6 mg/dL (ref 0.50–1.35)
Creatinine, Ser: 0.6 mg/dL (ref 0.50–1.35)
Creatinine, Ser: 0.59 mg/dL (ref 0.50–1.35)
Creatinine, Ser: 0.64 mg/dL (ref 0.50–1.35)
Creatinine, Ser: 0.54 mg/dL (ref 0.50–1.35)
GFR calc Af Amer: >90 mL/min (ref >90)
GFR calc Af Amer: >90 mL/min (ref >90)
GFR calc Af Amer: >90 mL/min (ref >90)
GFR calc non Af Amer: >90 mL/min (ref >90)
GFR calc non Af Amer: >90 mL/min (ref >90)
Glucose, Bld: 138 mg/dL — ABNORMAL HIGH (ref 70–99)
Glucose, Bld: 151 mg/dL — ABNORMAL HIGH (ref 70–99)
Glucose, Bld: 87 mg/dL (ref 70–99)
Glucose, Bld: 89 mg/dL (ref 70–99)
Glucose, Bld: 95 mg/dL (ref 70–99)
Glucose, Bld: 108 mg/dL — ABNORMAL HIGH (ref 70–99)
Glucose, Bld: 108 mg/dL — ABNORMAL HIGH (ref 70–99)
Glucose, Bld: 111 mg/dL — ABNORMAL HIGH (ref 70–99)
POTASSIUM: 3.6 meq/L — AB (ref 3.7–5.3)
POTASSIUM: 3.7 meq/L (ref 3.7–5.3)
POTASSIUM: 3.4 meq/L — AB (ref 3.7–5.3)
Potassium: 3.5 mEq/L — ABNORMAL LOW (ref 3.7–5.3)
Potassium: 3.4 mEq/L — ABNORMAL LOW (ref 3.7–5.3)
Potassium: 3.6 mEq/L — ABNORMAL LOW (ref 3.7–5.3)
Potassium: 3.9 mEq/L (ref 3.7–5.3)
Potassium: 3.1 mEq/L — ABNORMAL LOW (ref 3.7–5.3)
SODIUM: 129 meq/L — AB (ref 137–147)
SODIUM: 129 meq/L — AB (ref 137–147)
SODIUM: 128 meq/L — AB (ref 137–147)
SODIUM: 126 meq/L — AB (ref 137–147)
Sodium: 124 mEq/L — ABNORMAL LOW (ref 137–147)
Sodium: 128 mEq/L — ABNORMAL LOW (ref 137–147)
Sodium: 131 mEq/L — ABNORMAL LOW (ref 137–147)
Sodium: 128 mEq/L — ABNORMAL LOW (ref 137–147)

## 2013-05-19 LAB — HEPATIC FUNCTION PANEL
ALT: 14 U/L (ref 0–53)
AST: 41 U/L — AB (ref 0–37)
Albumin: 2.9 g/dL — ABNORMAL LOW (ref 3.5–5.2)
Alkaline Phosphatase: 57 U/L (ref 39–117)
TOTAL PROTEIN: 5.6 g/dL — AB (ref 6.0–8.3)
Total Bilirubin: 0.6 mg/dL (ref 0.3–1.2)

## 2013-05-19 LAB — CK TOTAL AND CKMB (NOT AT ARMC)
CK, MB: 11.6 ng/mL (ref 0.3–4.0)
RELATIVE INDEX: 1.1 (ref 0.0–2.5)
Total CK: 1048 U/L — ABNORMAL HIGH (ref 7–232)

## 2013-05-19 LAB — MAGNESIUM: MAGNESIUM: 1.9 mg/dL (ref 1.5–2.5)

## 2013-05-19 LAB — TROPONIN I: Troponin I: <0.3 ng/mL (ref <0.30)

## 2013-05-19 LAB — AMMONIA: Ammonia: 22 umol/L (ref 11–60)

## 2013-05-19 LAB — PHOSPHORUS: Phosphorus: 2.6 mg/dL (ref 2.3–4.6)

## 2013-05-19 LAB — PROCALCITONIN: Procalcitonin: <0.1 ng/mL

## 2013-05-19 LAB — HEMOGLOBIN A1C
HEMOGLOBIN A1C: 5.6 % (ref <5.7)
Mean Plasma Glucose: 114 mg/dL (ref <117)

## 2013-05-19 LAB — VITAMIN B12: VITAMIN B 12: 363 pg/mL (ref 211–911)

## 2013-05-19 MED ORDER — THIAMINE HCL 100 MG/ML IJ SOLN
100.0000 mg | Freq: Every day | INTRAMUSCULAR | Status: DC
  Administered 2013-05-19: 100 mg via INTRAVENOUS
  Filled 2013-05-19 (×7): qty 1

## 2013-05-19 MED ORDER — ENOXAPARIN SODIUM 40 MG/0.4ML ~~LOC~~ SOLN
40.0000 mg | SUBCUTANEOUS | Status: DC
  Administered 2013-05-19 – 2013-05-24 (×5): 40 mg via SUBCUTANEOUS
  Filled 2013-05-19 (×7): qty 0.4

## 2013-05-19 MED ORDER — LORAZEPAM 2 MG/ML IJ SOLN
1.0000 mg | Freq: Four times a day (QID) | INTRAMUSCULAR | Status: AC | PRN
  Administered 2013-05-19 – 2013-05-21 (×5): 1 mg via INTRAVENOUS
  Filled 2013-05-19 (×6): qty 1

## 2013-05-19 MED ORDER — PNEUMOCOCCAL VAC POLYVALENT 25 MCG/0.5ML IJ INJ
0.5000 mL | INJECTION | INTRAMUSCULAR | Status: AC
  Administered 2013-05-20: 0.5 mL via INTRAMUSCULAR
  Filled 2013-05-19: qty 0.5

## 2013-05-19 MED ORDER — DEXTROSE 5 % IV SOLN
INTRAVENOUS | Status: DC
  Administered 2013-05-19: 125 mL via INTRAVENOUS

## 2013-05-19 MED ORDER — FOLIC ACID 1 MG PO TABS
1.0000 mg | ORAL_TABLET | Freq: Every day | ORAL | Status: DC
  Administered 2013-05-19 – 2013-05-25 (×7): 1 mg via ORAL
  Filled 2013-05-19 (×7): qty 1

## 2013-05-19 MED ORDER — ATROPINE SULFATE 0.1 MG/ML IJ SOLN
INTRAMUSCULAR | Status: AC
  Filled 2013-05-19: qty 10

## 2013-05-19 MED ORDER — ADULT MULTIVITAMIN W/MINERALS CH
1.0000 | ORAL_TABLET | Freq: Every day | ORAL | Status: DC
Start: 2013-05-19 — End: 2013-05-25
  Administered 2013-05-19 – 2013-05-25 (×7): 1 via ORAL
  Filled 2013-05-19 (×7): qty 1

## 2013-05-19 MED ORDER — POTASSIUM CHLORIDE CRYS ER 20 MEQ PO TBCR
40.0000 meq | EXTENDED_RELEASE_TABLET | Freq: Once | ORAL | Status: AC
  Administered 2013-05-19: 40 meq via ORAL
  Filled 2013-05-19: qty 2

## 2013-05-19 MED ORDER — POTASSIUM CHLORIDE 10 MEQ/100ML IV SOLN: 10.0000 meq | INTRAVENOUS | Status: DC

## 2013-05-19 MED ORDER — DESMOPRESSIN ACETATE 4 MCG/ML IJ SOLN
2.0000 ug | Freq: Three times a day (TID) | INTRAMUSCULAR | Status: DC
  Filled 2013-05-19 (×2): qty 0.5

## 2013-05-19 MED ORDER — VITAMIN B-1 100 MG PO TABS
100.0000 mg | ORAL_TABLET | Freq: Every day | ORAL | Status: DC
  Administered 2013-05-20 – 2013-05-25 (×6): 100 mg via ORAL
  Filled 2013-05-19 (×7): qty 1

## 2013-05-19 MED ORDER — POTASSIUM CHLORIDE 10 MEQ/100ML IV SOLN
10.0000 meq | INTRAVENOUS | Status: DC
  Administered 2013-05-19 (×2): 10 meq via INTRAVENOUS
  Filled 2013-05-19 (×3): qty 100

## 2013-05-19 MED ORDER — NOREPINEPHRINE BITARTRATE 1 MG/ML IJ SOLN
2.0000 ug/min | INTRAVENOUS | Status: DC
  Administered 2013-05-19: 5 ug/min via INTRAVENOUS
  Filled 2013-05-19: qty 4

## 2013-05-19 MED ORDER — PHENYLEPHRINE HCL 10 MG/ML IJ SOLN
30.0000 ug/min | INTRAMUSCULAR | Status: DC
  Administered 2013-05-19: 55 ug/min via INTRAVENOUS
  Administered 2013-05-19: 60 ug/min via INTRAVENOUS
  Filled 2013-05-19 (×3): qty 1

## 2013-05-19 MED ORDER — BIOTENE DRY MOUTH MT LIQD: 15.0000 mL | Freq: Two times a day (BID) | OROMUCOSAL | Status: DC

## 2013-05-19 MED ORDER — DESMOPRESSIN ACETATE 4 MCG/ML IJ SOLN
2.0000 ug | Freq: Three times a day (TID) | INTRAMUSCULAR | Status: DC
  Administered 2013-05-19: 2 ug via INTRAVENOUS
  Filled 2013-05-19 (×3): qty 0.5

## 2013-05-19 MED ORDER — LORAZEPAM 1 MG PO TABS
1.0000 mg | ORAL_TABLET | Freq: Four times a day (QID) | ORAL | Status: AC | PRN
Start: 2013-05-19 — End: 2013-05-22
  Filled 2013-05-19: qty 1

## 2013-05-19 NOTE — Progress Notes (Signed)
CCM Interim Progress Note.  Discussed rapidly rising Na despite efforts to control rate of rise with both Dr. Darrick Pennaeterding and Dr. Marchelle Gearingamaswamy.  Patient is likely auto-regulating sodium mgmt and will hold off on desmopressin for now- may reconsider during rounds.  Stacy GardnerNeema Preslie Depasquale, MD (IMTS, PGY3)

## 2013-05-19 NOTE — Progress Notes (Signed)
CRITICAL VALUE ALERT  Critical value received:  ckmb  11.6  Date of notification:  04/21  Time of notification:  0645  Critical value read back:yes  Nurse who received alert:  A.Canary BrimSperry RN  MD notified (1st page):  Wilson MD  Time of first page:  [phone]  MD notified (2nd page):  Time of second page:  Responding MD:  Andrey CampanileWilson MD  Time MD responded:  [phone]

## 2013-05-19 NOTE — Procedures (Signed)
Central Venous Catheter Insertion Procedure Note [name] PerchesKeith [name] [id number] [birth year]  Procedure: Insertion of Central Venous Catheter Indications: Assessment of intravascular volume, Drug and/or fluid administration and Frequent blood sampling  Procedure Details Consent: Risks of procedure as well as the alternatives and risks of each were explained to the (patient/caregiver).  Consent for procedure obtained. Time Out: Verified patient identification, verified procedure, site/side was marked, verified correct patient position, special equipment/implants available, medications/allergies/relevent history reviewed, required imaging and test results available.  Performed  Maximum sterile technique was used including antiseptics, cap, gloves, gown, hand hygiene, mask and sheet. Skin prep: Chlorhexidine; local anesthetic administered A antimicrobial bonded/coated triple lumen catheter was placed in the left internal jugular vein using the Seldinger technique.  Evaluation Blood flow good Complications: No apparent complications Patient did tolerate procedure well. Chest X-ray ordered to verify placement.  CXR: pending.  Procedure performed under direct supervision of Dr. Kendrick FriesMcQuaid and with ultrasound guidance for real time vessel cannulation.     Canary BrimBrandi Ollis, NP-C [place] [facility] Pgr: [phone] or [phone]    05/19/2013, 3:04 PM  Attending: Heber CarolinaBrent McQuaid, MD [place] [facility] Pager: [phone] Cell: [phone] If no response, call [phone]

## 2013-05-19 NOTE — Progress Notes (Addendum)
PULMONARY / CRITICAL CARE MEDICINE   Name: [name] PerchesKeith [name] MRN: [id number] DOB: [birth year]    ADMISSION DATE: 05/18/2013  CONSULTATION DATE: 05/18/2013   REFERRING MD : EDP  PRIMARY SERVICE: PCCM   CHIEF COMPLAINT: AMS   BRIEF PATIENT DESCRIPTION:  [name] [name] is a 58 y.o. M with PMH history of prior left pneumothorax after a fall which required chest tube placement. Admitted for spontaneous pneumothorax to cVTs service 05/10/13 and Rx with chest tube and discharged 05/13/13. Now presented on 4/20 with AMS. A mailman found him lying in the yard and dispatched EMS. Upon EMS arrival, pt was very combative and aggressive, he required 7mg  of Versed en route to ED. In ED, initial sodium found to be 108 at 2pm (was 140 on 05/10/13) with repeat of 113 by 4pm (5meq correction in 2 hours). Head CT negative for acute process. Pt remained combative but then fell asleep after he was given some blankets.  PCCM was asked to admit the pt to the ICU for further evaluation.   SIGNIFICANT EVENTS / STUDIES:  4/20 admitted with significant hyponatremia and AMS.  420 Head CT >>> no acute process.  4/20 HIV antibody >>> neg  4/20 RPR >>> neg  LINES / TUBES:  LIJ CVC 04/21 >>>  ANTIBIOTICS:  None   CULTURES  Blood 4/20 >>>  Urine 4/20 >>>   INTERVAL HISTORY: overly rapid Na correction overnight. Fluids switched from NS to D5W. BP soft with Precedex.   VITAL SIGNS:  Temp: [98 F (36.7 C)-99.4 F (37.4 C)] 98 F (36.7 C) (04/21 0335)  Pulse Rate: [41-174] 42 (04/21 0700)  Resp: [15-32] 15 (04/21 0700)  BP: (83-139)/(40-60) 95/55 mmHg (04/21 0700)  SpO2: [91 %-100 %] 100 % (04/21 0700)  Weight: [75.7 kg (166 lb 14.2 oz)-81.1 kg (178 lb 12.7 oz)] 75.7 kg (166 lb 14.2 oz) (04/21 0500)   HEMODYNAMICS:   VENTILATOR SETTINGS: N/A   INTAKE / OUTPUT:  Intake/Output  04/20 0701 - 04/21 0700 04/21 0701 - 04/22 0700  I.V. (mL/kg) 1582.1 (20.9)  Total Intake(mL/kg) 1582.1 (20.9)  Urine (mL/kg/hr) 5050   Total Output 5050  Net -3467.9  Urine Occurrence 1 x   PHYSICAL EXAMINATION:  General: Awake, in NAD  Neuro: AAO x 1 (person), responding appropriately  HEENT: WNL  Cardiovascular: RRR  Lungs: CTA B/L  Abdomen: +BS, soft, NT, ND  Musculoskeletal: Able to move all four extremities voluntarily  Skin: Intact   LABS:   CBC   Recent Labs  Lab  05/18/13 1411  05/19/13 0530   WBC  17.2*  9.5   HGB  14.0  12.9*   HCT  36.6*  34.9*   PLT  361  350    BMET   Recent Labs  Lab  05/18/13 2140  05/19/13 0105  05/19/13 0530   NA  119*  124*  128*   K  4.2  3.5*  3.4*   CL  83*  87*  90*   CO2  22  23  23    BUN  3*  3*  3*   CREATININE  0.53  0.52  0.57   GLUCOSE  93  138*  151*    Electrolytes   Recent Labs  Lab  05/18/13 2140  05/19/13 0105  05/19/13 0530   CALCIUM  8.6  8.7  8.6   MG  1.5  --  1.9   PHOS  2.7  --  2.6    Sepsis Markers  Recent Labs  Lab  05/18/13 1701  05/18/13 2140  05/19/13 0530   LATICACIDVEN  4.3*  2.1  --   PROCALCITON  --  <0.10  <0.10    Liver Enzymes   Recent Labs  Lab  05/18/13 1411  05/19/13 0530   AST  59*  41*   ALT  18  14   ALKPHOS  66  57   BILITOT  0.8  0.6   ALBUMIN  3.4*  2.9*    Cardiac Enzymes   Recent Labs  Lab  05/18/13 1411  05/19/13 0530   TROPONINI  <0.30  <0.30    Glucose   Recent Labs  Lab  05/18/13 1406  05/18/13 1851   GLUCAP  103*  100*    Imaging  Dg Chest 1 View  05/18/2013 CLINICAL DATA: Altered mental status. Unresponsive patient. EXAM: CHEST - 1 VIEW COMPARISON: DG CHEST 2 VIEW dated 05/13/2013 FINDINGS: Inferior half of the chest is excluded from view. The patient was noncompliant with imaging. The cardiopericardial silhouette and mediastinal contours appear within normal limits. No airspace disease or effusion. No pneumothorax identified. Monitoring leads project over the chest. IMPRESSION: 1. No visible acute cardiopulmonary disease. 2. Lung bases are cut off due to patient non  compliance with imaging. Electronically Signed By: Andreas NewportGeoffrey Lamke M.D. On: 05/18/2013 15:15  Ct Head Wo Contrast  05/18/2013 CLINICAL DATA: Fall, with pain and injury. Neck pain. Head pain. EXAM: CT HEAD WITHOUT CONTRAST CT CERVICAL SPINE WITHOUT CONTRAST TECHNIQUE: Multidetector CT imaging of the head and cervical spine was performed following the standard protocol without intravenous contrast. Multiplanar CT image reconstructions of the cervical spine were also generated. COMPARISON: None. FINDINGS: CT HEAD FINDINGS No evidence for acute infarction, hemorrhage, mass lesion, hydrocephalus, or extra-axial fluid. There is mild atrophy. Mild chronic microvascular ischemic change is noted. Mild carotid siphon vascular calcification is noted. Calvarium is intact. There is no acute sinus or mastoid disease. CT CERVICAL SPINE FINDINGS There is no visible cervical spine fracture, traumatic subluxation, prevertebral soft tissue swelling, or intraspinal hematoma. Advanced disc space narrowing at C5-6 and C6-7 with mild straightening of the cervical lordosis. Multilevel facet arthropathy. Bilateral foraminal narrowing at C5-6 and C6-7. Lung apices show mild scarring. Airway midline. Carotid atherosclerosis. IMPRESSION: Mild atrophy and small vessel disease. No acute intracranial findings. Cervical spondylosis. No fracture or traumatic subluxation. Electronically Signed By: Davonna BellingJohn Curnes M.D. On: 05/18/2013 16:36  Ct Cervical Spine Wo Contrast  05/18/2013 CLINICAL DATA: Fall, with pain and injury. Neck pain. Head pain. EXAM: CT HEAD WITHOUT CONTRAST CT CERVICAL SPINE WITHOUT CONTRAST TECHNIQUE: Multidetector CT imaging of the head and cervical spine was performed following the standard protocol without intravenous contrast. Multiplanar CT image reconstructions of the cervical spine were also generated. COMPARISON: None. FINDINGS: CT HEAD FINDINGS No evidence for acute infarction, hemorrhage, mass lesion, hydrocephalus, or  extra-axial fluid. There is mild atrophy. Mild chronic microvascular ischemic change is noted. Mild carotid siphon vascular calcification is noted. Calvarium is intact. There is no acute sinus or mastoid disease. CT CERVICAL SPINE FINDINGS There is no visible cervical spine fracture, traumatic subluxation, prevertebral soft tissue swelling, or intraspinal hematoma. Advanced disc space narrowing at C5-6 and C6-7 with mild straightening of the cervical lordosis. Multilevel facet arthropathy. Bilateral foraminal narrowing at C5-6 and C6-7. Lung apices show mild scarring. Airway midline. Carotid atherosclerosis. IMPRESSION: Mild atrophy and small vessel disease. No acute intracranial findings. Cervical spondylosis. No fracture or traumatic subluxation. Electronically Signed By: Davonna BellingJohn Curnes  M.D. On: 05/18/2013 16:36   ASSESSMENT / PLAN:   PULMONARY  A:  No acute issue.  He has no known PTX hx as reported in H&P.   P:  - Monitor.   CARDIOVASCULAR  A:  Hypotension, resolving Bradycardia P:  - Monitor CVP, MAP - Neo-->Levo 2/2 to bradycardia  RENAL  A:  Significant Hyponatremia - pt hypovolemic and etiology possibly 2/2 beer potomania (was he drinking a lot after dc from hospital?) Patient has corrected too quickly and NS changed to D5W.  Despite D5W patient's Na has continued to increase (from 124 --> 128 over 2 hour period).  Hypokalemia  Concern for rhabdo - unknown down time, elevated but improving CK (2052 --> 1048)  P:  - D5W @ 129mL/hr given rapid correction  - BMET q4h  - K supplemented  - Continue IVF's (D5W for now while Na elevated)   GASTROINTESTINAL  A:  No acute issues.  P:  - Regular diet  HEMATOLOGIC  A:  No acute abnormalities.  P:  - Monitor CBC  - VTE ppx - Lovenox   INFECTIOUS  A:  Leukocytosis- likely acute phase reactant. Lactic acid 4.3--> 2.1 (normal), PCT < 0.10.  P:  - F/u blood and urine cultures  - Monitor for fever / leukocytosis.   ENDOCRINE  A:   No acute abnormalities.  P:  - Monitor glucose   NEUROLOGIC  A:  Acute Encephalopathy - resolving, likely related to significant severe hyponatremia.  Concern for seizures  Benzo use (tox screen pos)  P:  - Thiamine  - Ativan prn  - CIWA protocol - Stop Precedex   Evelena Peat, DO IMTS, PGY1  Attending:  I have seen and examined the patient with nurse practitioner/resident and agree with the note above.   We are concerned about the rapidity of correction of sodium. He is doing well despite this. Will add ddavp to slow correction of hyponatremia Goal Na 120 Continue D5W Close monitoring in ICU CVL placed Add levophed> source of shock uncertain Check Echo  CC time 45 minutes  Heber Wolsey, MD [facility] Pager: [phone] Cell: [phone] If no response, call [phone]

## 2013-05-19 NOTE — Progress Notes (Signed)
Patient agitated upon nurse entering the room. He follows commands and states that his name is not [name]. He states that his name is [name] [name] and his birth date is 2055/04/07 and he states that [place] [address]. Registration was contacted and they are following up. Dr. Henrene PastorMcQuiad  made aware of the situation. Huma Imhoff Drexel IhaBianca Jennylee Uehara, RN

## 2013-05-19 NOTE — Progress Notes (Signed)
Patient admitted to 2M12 on 05/18/13 from ER.  He arrived on 2MW overnight. He was admitted under a different name and date of birth.  Registration was called and correct information received. New armband placed on the patient who is alert but agitated. Pharmacy, lab, Dr Kendrick FriesMcquaid, Dr. Andrey CampanileWilson, and e-link nurses made aware. Safety zone portal in process.  Maureena Dabbs Drexel IhaBianca Taiesha Bovard, RN

## 2013-05-20 ENCOUNTER — Encounter (HOSPITAL_COMMUNITY): Payer: Self-pay | Admitting: Internal Medicine

## 2013-05-20 DIAGNOSIS — G9340 Encephalopathy, unspecified: Secondary | ICD-10-CM

## 2013-05-20 DIAGNOSIS — E871 Hypo-osmolality and hyponatremia: Secondary | ICD-10-CM | POA: Diagnosis present

## 2013-05-20 LAB — BASIC METABOLIC PANEL
BUN: 3 mg/dL — ABNORMAL LOW (ref 6–23)
BUN: 3 mg/dL — ABNORMAL LOW (ref 6–23)
BUN: <3 mg/dL — ABNORMAL LOW (ref 6–23)
CALCIUM: 8.5 mg/dL (ref 8.4–10.5)
CHLORIDE: 91 meq/L — AB (ref 96–112)
CHLORIDE: 86 meq/L — AB (ref 96–112)
CHLORIDE: 91 meq/L — AB (ref 96–112)
CO2: 20 mEq/L (ref 19–32)
CO2: 22 meq/L (ref 19–32)
CO2: 23 meq/L (ref 19–32)
CO2: 25 mEq/L (ref 19–32)
Calcium: 8.4 mg/dL (ref 8.4–10.5)
Calcium: 8.5 mg/dL (ref 8.4–10.5)
Calcium: 8.5 mg/dL (ref 8.4–10.5)
Chloride: 88 mEq/L — ABNORMAL LOW (ref 96–112)
Creatinine, Ser: 0.53 mg/dL (ref 0.50–1.35)
Creatinine, Ser: 0.54 mg/dL (ref 0.50–1.35)
Creatinine, Ser: 0.57 mg/dL (ref 0.50–1.35)
Creatinine, Ser: 0.58 mg/dL (ref 0.50–1.35)
GFR calc Af Amer: >90 mL/min (ref >90)
GFR calc Af Amer: >90 mL/min (ref >90)
GFR calc non Af Amer: >90 mL/min (ref >90)
GFR calc non Af Amer: >90 mL/min (ref >90)
GLUCOSE: 107 mg/dL — AB (ref 70–99)
Glucose, Bld: 104 mg/dL — ABNORMAL HIGH (ref 70–99)
Glucose, Bld: 121 mg/dL — ABNORMAL HIGH (ref 70–99)
Glucose, Bld: 82 mg/dL (ref 70–99)
POTASSIUM: 3.8 meq/L (ref 3.7–5.3)
POTASSIUM: 3.3 meq/L — AB (ref 3.7–5.3)
Potassium: 3.3 mEq/L — ABNORMAL LOW (ref 3.7–5.3)
Potassium: 3.5 mEq/L — ABNORMAL LOW (ref 3.7–5.3)
SODIUM: 127 meq/L — AB (ref 137–147)
SODIUM: 123 meq/L — AB (ref 137–147)
Sodium: 121 mEq/L — CL (ref 137–147)
Sodium: 124 mEq/L — ABNORMAL LOW (ref 137–147)

## 2013-05-20 LAB — CBC
HCT: 33 % — ABNORMAL LOW (ref 39.0–52.0)
Hemoglobin: 12.2 g/dL — ABNORMAL LOW (ref 13.0–17.0)
MCH: 31.4 pg (ref 26.0–34.0)
MCHC: 37 g/dL — AB (ref 30.0–36.0)
MCV: 85.1 fL (ref 78.0–100.0)
Platelets: 298 10*3/uL (ref 150–400)
RBC: 3.88 MIL/uL — ABNORMAL LOW (ref 4.22–5.81)
RDW: 13.2 % (ref 11.5–15.5)
WBC: 9 10*3/uL (ref 4.0–10.5)

## 2013-05-20 LAB — PHOSPHORUS: Phosphorus: 2.3 mg/dL (ref 2.3–4.6)

## 2013-05-20 LAB — CK: Total CK: 503 U/L — ABNORMAL HIGH (ref 7–232)

## 2013-05-20 LAB — FOLATE RBC: RBC FOLATE: 437 ng/mL (ref >280)

## 2013-05-20 LAB — MAGNESIUM: Magnesium: 2 mg/dL (ref 1.5–2.5)

## 2013-05-20 MED ORDER — DEXTROSE 5 % IV SOLN
INTRAVENOUS | Status: DC
  Administered 2013-05-20: 125 mL via INTRAVENOUS

## 2013-05-20 MED ORDER — DESMOPRESSIN ACETATE 4 MCG/ML IJ SOLN
2.0000 ug | Freq: Three times a day (TID) | INTRAMUSCULAR | Status: DC
  Administered 2013-05-20: 2 ug via INTRAVENOUS
  Filled 2013-05-20 (×2): qty 0.5

## 2013-05-20 MED ORDER — POTASSIUM CHLORIDE 10 MEQ/50ML IV SOLN
10.0000 meq | INTRAVENOUS | Status: AC
  Administered 2013-05-20 (×2): 10 meq via INTRAVENOUS
  Filled 2013-05-20 (×2): qty 50

## 2013-05-20 MED ORDER — WHITE PETROLATUM GEL
Status: AC
  Administered 2013-05-20: 13:00:00
  Filled 2013-05-20: qty 5

## 2013-05-20 MED ORDER — POTASSIUM CHLORIDE CRYS ER 20 MEQ PO TBCR
40.0000 meq | EXTENDED_RELEASE_TABLET | Freq: Once | ORAL | Status: AC
  Administered 2013-05-20: 40 meq via ORAL
  Filled 2013-05-20: qty 2

## 2013-05-20 MED ORDER — POTASSIUM CHLORIDE 10 MEQ/50ML IV SOLN: 10.0000 meq | INTRAVENOUS | Status: DC

## 2013-05-20 MED ORDER — DEXTROSE 5 % IV SOLN: INTRAVENOUS | Status: DC

## 2013-05-20 NOTE — Progress Notes (Signed)
PULMONARY / CRITICAL CARE MEDICINE   Name: [name] [name] MRN: [id number] DOB: 14-Jul-1955    ADMISSION DATE: 05/18/2013  CONSULTATION DATE: 05/18/2013   REFERRING MD : EDP  PRIMARY SERVICE: PCCM   CHIEF COMPLAINT: AMS   BRIEF PATIENT DESCRIPTION:  [name] [name] is a 58 year old male with unknown medical history.  A mailman found him lying in the yard and dispatched EMS. Upon EMS arrival, pt was very combative and aggressive, he required 7mg  of Versed en route to ED. In ED, initial sodium found to be 108 at 2pm (was 140 on 05/10/13) with repeat of 113 by 4pm ( correction in 2 hours). Head CT negative for acute process. Pt remained combative but then fell asleep after he was given some blankets.  PCCM was asked to admit the pt to the ICU for further evaluation.   SIGNIFICANT EVENTS / STUDIES:  4/20 admitted with significant hyponatremia and AMS.  420 Head CT >>> no acute process.  4/20 HIV antibody >>> neg  4/20 RPR >>> neg  LINES / TUBES:  LIJ CVC 04/21 >>>  ANTIBIOTICS:  None   CULTURES  Blood 4/20 >>>  Urine 4/20 >>>   INTERVAL HISTORY: Patient with CIWA score around 13.  Spoke with the patient's sister who reports that the patient has been confused and had memory issues for about 1 year and she worries about his ability to live independently.    VITAL SIGNS:  Temp: [98 F (36.7 C)-99.4 F (37.4 C)] 98 F (36.7 C) (04/21 0335)  Pulse Rate: [41-174] 42 (04/21 0700)  Resp: [15-32] 15 (04/21 0700)  BP: (83-139)/(40-60) 95/55 mmHg (04/21 0700)  SpO2: [91 %-100 %] 100 % (04/21 0700)  Weight: [75.7 kg (166 lb 14.2 oz)-81.1 kg (178 lb 12.7 oz)] 75.7 kg (166 lb 14.2 oz) (04/21 0500)   HEMODYNAMICS:   VENTILATOR SETTINGS: N/A   INTAKE / OUTPUT:  Intake/Output  04/20 0701 - 04/21 0700 04/21 0701 - 04/22 0700  I.V. (mL/kg) 1582.1 (20.9)  Total Intake(mL/kg) 1582.1 (20.9)  Urine (mL/kg/hr) 5050  Total Output 5050  Net -3467.9  Urine Occurrence 1 x   PHYSICAL EXAMINATION:   General: Awake, in NAD; at times trying to get up to urinate despite reassurance that he has Foley Neuro: AAO x 2-3, responding appropriately  HEENT: WNL  Cardiovascular: RRR  Lungs: CTA B/L  Abdomen: +BS, soft, NT, ND  Musculoskeletal: Able to move all four extremities voluntarily  Skin: Intact   LABS:   CBC   Recent Labs  Lab  05/18/13 1411  05/19/13 0530   WBC  17.2*  9.5   HGB  14.0  12.9*   HCT  36.6*  34.9*   PLT  361  350    BMET   Recent Labs  Lab  05/18/13 2140  05/19/13 0105  05/19/13 0530   NA  119*  124*  128*   K  4.2  3.5*  3.4*   CL  83*  87*  90*   CO2  22  23  23    BUN  3*  3*  3*   CREATININE  0.53  0.52  0.57   GLUCOSE  93  138*  151*    Electrolytes   Recent Labs  Lab  05/18/13 2140  05/19/13 0105  05/19/13 0530   CALCIUM  8.6  8.7  8.6   MG  1.5  --  1.9   PHOS  2.7  --  2.6  Sepsis Markers   Recent Labs  Lab  05/18/13 1701  05/18/13 2140  05/19/13 0530   LATICACIDVEN  4.3*  2.1  --   PROCALCITON  --  <0.10  <0.10    Liver Enzymes   Recent Labs  Lab  05/18/13 1411  05/19/13 0530   AST  59*  41*   ALT  18  14   ALKPHOS  66  57   BILITOT  0.8  0.6   ALBUMIN  3.4*  2.9*    Cardiac Enzymes   Recent Labs  Lab  05/18/13 1411  05/19/13 0530   TROPONINI  <0.30  <0.30    Glucose   Recent Labs  Lab  05/18/13 1406  05/18/13 1851   GLUCAP  103*  100*    Imaging  Dg Chest 1 View  05/18/2013 CLINICAL DATA: Altered mental status. Unresponsive patient. EXAM: CHEST - 1 VIEW COMPARISON: DG CHEST 2 VIEW dated 05/13/2013 FINDINGS: Inferior half of the chest is excluded from view. The patient was noncompliant with imaging. The cardiopericardial silhouette and mediastinal contours appear within normal limits. No airspace disease or effusion. No pneumothorax identified. Monitoring leads project over the chest. IMPRESSION: 1. No visible acute cardiopulmonary disease. 2. Lung bases are cut off due to patient non compliance with  imaging. Electronically Signed By: Andreas NewportGeoffrey Lamke M.D. On: 05/18/2013 15:15  Ct Head Wo Contrast  05/18/2013 CLINICAL DATA: Fall, with pain and injury. Neck pain. Head pain. EXAM: CT HEAD WITHOUT CONTRAST CT CERVICAL SPINE WITHOUT CONTRAST TECHNIQUE: Multidetector CT imaging of the head and cervical spine was performed following the standard protocol without intravenous contrast. Multiplanar CT image reconstructions of the cervical spine were also generated. COMPARISON: None. FINDINGS: CT HEAD FINDINGS No evidence for acute infarction, hemorrhage, mass lesion, hydrocephalus, or extra-axial fluid. There is mild atrophy. Mild chronic microvascular ischemic change is noted. Mild carotid siphon vascular calcification is noted. Calvarium is intact. There is no acute sinus or mastoid disease. CT CERVICAL SPINE FINDINGS There is no visible cervical spine fracture, traumatic subluxation, prevertebral soft tissue swelling, or intraspinal hematoma. Advanced disc space narrowing at C5-6 and C6-7 with mild straightening of the cervical lordosis. Multilevel facet arthropathy. Bilateral foraminal narrowing at C5-6 and C6-7. Lung apices show mild scarring. Airway midline. Carotid atherosclerosis. IMPRESSION: Mild atrophy and small vessel disease. No acute intracranial findings. Cervical spondylosis. No fracture or traumatic subluxation. Electronically Signed By: Davonna BellingJohn Curnes M.D. On: 05/18/2013 16:36  Ct Cervical Spine Wo Contrast  05/18/2013 CLINICAL DATA: Fall, with pain and injury. Neck pain. Head pain. EXAM: CT HEAD WITHOUT CONTRAST CT CERVICAL SPINE WITHOUT CONTRAST TECHNIQUE: Multidetector CT imaging of the head and cervical spine was performed following the standard protocol without intravenous contrast. Multiplanar CT image reconstructions of the cervical spine were also generated. COMPARISON: None. FINDINGS: CT HEAD FINDINGS No evidence for acute infarction, hemorrhage, mass lesion, hydrocephalus, or extra-axial fluid.  There is mild atrophy. Mild chronic microvascular ischemic change is noted. Mild carotid siphon vascular calcification is noted. Calvarium is intact. There is no acute sinus or mastoid disease. CT CERVICAL SPINE FINDINGS There is no visible cervical spine fracture, traumatic subluxation, prevertebral soft tissue swelling, or intraspinal hematoma. Advanced disc space narrowing at C5-6 and C6-7 with mild straightening of the cervical lordosis. Multilevel facet arthropathy. Bilateral foraminal narrowing at C5-6 and C6-7. Lung apices show mild scarring. Airway midline. Carotid atherosclerosis. IMPRESSION: Mild atrophy and small vessel disease. No acute intracranial findings. Cervical spondylosis. No fracture or traumatic subluxation. Electronically  Signed By: Davonna BellingJohn Curnes M.D. On: 05/18/2013 16:36   ASSESSMENT / PLAN:   PULMONARY  A:  No acute abnormality. P:  - Monitor.   CARDIOVASCULAR  A:  Hypotension, resolved; off Levophed overnight Bradycardia, resolved; CE negative P:  - Monitor vitals  RENAL  A:  Significant Hyponatremia - pt hypovolemic and etiology possibly 2/2 beer potomania (was he drinking a lot after dc from hospital?)  Family say there is no way he could have been drinking because they buy his groceries and he would not have access. They say he drinks more than he eats but not excessive amounts of water (doubt psychogenic polydipsia).  Patient has corrected too quickly and NS changed to D5W.  DDAVP added to decrease Na.  Today's goal ~ 130, but no higher than 132. Hypokalemia  Concern for rhabdo - unknown down time, elevated but improving CK (2052 --> 1048-->503), renal function normal.  P:  - Continue DDAVP 2mcg IV q8h (end date 04/23) - BMET q6h  - K supplemented  - Resume D5W (end date 04/23)   GASTROINTESTINAL  A:  No acute abnormalities P:  - Regular diet  HEMATOLOGIC  A:  No acute abnormalities P:  - Monitor CBC  - VTE ppx - Lovenox   INFECTIOUS  A:   Leukocytosis, resolved - likely acute phase reactant. Lactic acid 4.3--> 2.1 (normal), PCT < 0.10.  P:  - F/u blood and urine cultures  - Monitor for fever / leukocytosis.   ENDOCRINE  A:  No acute abnormalities  P:  - Monitor glucose   NEUROLOGIC  A:  Acute on chronic encephalopathy - resolving, likely related to significant severe hyponatremia; however family reports confusion and dementia symptoms for the past 12 months (? Korsakoff's). Benzo use (tox screen pos)  P:  - Thiamine, Folate, multivitamin  - CIWA protocol - soft mechanical restraints and/or sitter for patient safety - social work consulted to assist sister/family with identifying resources to help with patient's care after d/c  Disp:  He is hemodynamically stable and no longer requires ICU level care.  Will transfer to med-surg today.  [facility] to assume care on 04/23.   Evelena PeatAlex Wilson, DO IMTS, PGY1  Attending:  I have seen and examined the patient with nurse practitioner/resident and agree with the note above.   Hyponatremia improved, initially corrected very quickly, rate of correction improved with ddavp and D5; goal for next 24 hours is Na 125-30 Family states he doesn't drink; by history it sounds like he has a "tea and toast diet" which likely contributed to his hyponatremia Allow to eat Needs sitter and restraints  PCCM off as of 4/23  Heber CarolinaBrent Stokes Rattigan, MD [facility] Pager: [phone] Cell: [phone] If no response, call [phone]

## 2013-05-20 NOTE — Progress Notes (Signed)
CRITICAL VALUE ALERT  Critical value received:  Na 121  Date of notification:  05/20/13  Time of notification:  2140  Critical value read back:yes  Nurse who received alert:  Marlaine HindElizabeth Rangel Echeverri  MD notified (1st page):  Melvenia BeamSimon  Time of first page:  2200  MD notified (2nd page):  Time of second page:  Responding MD:  Melvenia BeamSimon  Time MD responded:  2215

## 2013-05-20 NOTE — Progress Notes (Signed)
Interim Progress Note  Initial overly rapid correction of Na.  DDAVP and D5W added to slow rate of correction, which has improved with this therapy.  Goal for next 24 hours is 125-130 given patient presented with Na 108 on 04/20.  Na is now declining too rapidly, Na recently dropped 127 --> 123 in 8 hours.  Discussed with [facility], Dr. Sung AmabileSimonds.  Will stop DDAVP and D5W.  Will fluid restrict and continue regular diet and q6h BMP.  Evelena PeatAlex Hoa Deriso, DO IMTS, PGY1
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
## 2013-05-20 NOTE — Progress Notes (Signed)
[name] found in room trying to give pt paper work to sign for medicare/medicaid. Explained to pt's sister that legally he isn't able to sign because of his mental orientation. Pt currently a+ox2. [name] states she understands.

## 2013-05-20 NOTE — Clinical Social Work Note (Signed)
CSW received notification of family's request to explore disposition options.  Family not currently at bedside (out to lunch).    CSW will f/u.   Vickii PennaGina Noelie Renfrow, LCSWA [phone]  Clinical Social Work

## 2013-05-20 NOTE — Progress Notes (Signed)
[name] PerchesKeith [name] [id number]  Transfer Data: 05/20/2013 5:01 PM  Attending Provider: Lupita Leashouglas B McQuaid, MD  PCP:No primary provider on file.  Code Status: Full  [name] [name] is a 58 y.o. male patient transferred from 80M  -No acute distress noted.  -No complaints of shortness of breath.  -No complaints of chest pain.   ?   Allergies: Review of patient's allergies indicates no known allergies.  Past Medical History:  has no past medical history on file.  Past Surgical History:  has no past surgical history on file.  Social History:  reports that he drinks alcohol.  Skin:brusing, scabs, and abrasion  Patient/Family orientated to room. Information packet given to patient/family. Admission inpatient armband information verified with patient/family to include name and date of birth and placed on patient arm. Side rails up x 2, fall assessment and education completed with patient/family. Patient/family able to verbalize understanding of risk associated with falls and verbalized understanding to call for assistance before getting out of bed. Call light within reach. Patient/family able to voice and demonstrate understanding of unit orientation instructions.  Will continue to evaluate and treat per MD orders.

## 2013-05-20 NOTE — Progress Notes (Signed)
Covering CSW has met with pt sister Mancel Bale.   Full assessment to follow.  Hunt Oris, MSW, [place]

## 2013-05-20 NOTE — Progress Notes (Signed)
[facility] FOR AM LAB REPLACEMENT ONLY  The patient does [facility] based on the criteria listed below:   1. Is GFR >/= 40 ml/min? yes  Patient's GFR today is >90 2. Is urine output >/= 0.5 ml/kg/hr for the last 6 hours? yes Patient's UOP is 1.76 ml/kg/hr 3. Is BUN < 60 mg/dL? yes  Patient's BUN today is 3 4. Abnormal electrolyte(s):K + 3.3 5. Ordered repletion with: see order 6. If a panic level lab has been reported, has the CCM MD in charge been notified? yes.   Physician:  Dr. Yevette Edwardseterding  Jaylene Arrowood A Egypt Marchiano 05/20/2013 6:33 AM

## 2013-05-21 DIAGNOSIS — F1010 Alcohol abuse, uncomplicated: Secondary | ICD-10-CM | POA: Diagnosis present

## 2013-05-21 DIAGNOSIS — Z720 Tobacco use: Secondary | ICD-10-CM | POA: Diagnosis present

## 2013-05-21 DIAGNOSIS — F29 Unspecified psychosis not due to a substance or known physiological condition: Secondary | ICD-10-CM

## 2013-05-21 DIAGNOSIS — R404 Transient alteration of awareness: Secondary | ICD-10-CM

## 2013-05-21 DIAGNOSIS — R4182 Altered mental status, unspecified: Secondary | ICD-10-CM | POA: Diagnosis present

## 2013-05-21 DIAGNOSIS — F17200 Nicotine dependence, unspecified, uncomplicated: Secondary | ICD-10-CM

## 2013-05-21 LAB — BASIC METABOLIC PANEL
BUN: 3 mg/dL — ABNORMAL LOW (ref 6–23)
CALCIUM: 8.6 mg/dL (ref 8.4–10.5)
CALCIUM: 9 mg/dL (ref 8.4–10.5)
CO2: 22 mEq/L (ref 19–32)
CO2: 25 mEq/L (ref 19–32)
CO2: 24 mEq/L (ref 19–32)
CREATININE: 0.57 mg/dL (ref 0.50–1.35)
CREATININE: 0.55 mg/dL (ref 0.50–1.35)
CREATININE: 0.6 mg/dL (ref 0.50–1.35)
Calcium: 9 mg/dL (ref 8.4–10.5)
Chloride: 86 mEq/L — ABNORMAL LOW (ref 96–112)
Chloride: 90 mEq/L — ABNORMAL LOW (ref 96–112)
Chloride: 95 mEq/L — ABNORMAL LOW (ref 96–112)
GFR calc Af Amer: >90 mL/min (ref >90)
GFR calc Af Amer: >90 mL/min (ref >90)
GFR calc non Af Amer: >90 mL/min (ref >90)
GLUCOSE: 91 mg/dL (ref 70–99)
Glucose, Bld: 93 mg/dL (ref 70–99)
Glucose, Bld: 98 mg/dL (ref 70–99)
Potassium: 3.8 mEq/L (ref 3.7–5.3)
Potassium: 3.8 mEq/L (ref 3.7–5.3)
Potassium: 3.8 mEq/L (ref 3.7–5.3)
Sodium: 122 mEq/L — ABNORMAL LOW (ref 137–147)
Sodium: 127 mEq/L — ABNORMAL LOW (ref 137–147)
Sodium: 131 mEq/L — ABNORMAL LOW (ref 137–147)

## 2013-05-21 LAB — CBC
HEMATOCRIT: 31.1 % — AB (ref 39.0–52.0)
HEMOGLOBIN: 11.5 g/dL — AB (ref 13.0–17.0)
MCH: 31.3 pg (ref 26.0–34.0)
MCHC: 37 g/dL — AB (ref 30.0–36.0)
MCV: 84.5 fL (ref 78.0–100.0)
Platelets: 293 10*3/uL (ref 150–400)
RBC: 3.68 MIL/uL — ABNORMAL LOW (ref 4.22–5.81)
RDW: 12.9 % (ref 11.5–15.5)
WBC: 13.2 10*3/uL — ABNORMAL HIGH (ref 4.0–10.5)

## 2013-05-21 MED ORDER — NICOTINE 21 MG/24HR TD PT24
21.0000 mg | MEDICATED_PATCH | Freq: Every day | TRANSDERMAL | Status: DC
  Administered 2013-05-21 – 2013-05-25 (×5): 21 mg via TRANSDERMAL
  Filled 2013-05-21 (×5): qty 1

## 2013-05-21 MED ORDER — WHITE PETROLATUM GEL
Status: AC
  Administered 2013-05-21: 0.2
  Filled 2013-05-21: qty 5
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
## 2013-05-21 NOTE — Clinical Social Work Psychosocial (Signed)
Pt transferred to 5W from 33M.  33M CSW provided 5W CSW handoff/report for pt possible placement needs.  33M CSW signing off at this time.  Vickii PennaGina Towana Stenglein, LCSWA [phone]  Clinical Social Work

## 2013-05-21 NOTE — Progress Notes (Signed)
PT Cancellation Note  Patient Details Name: Gerhard PerchesKeith Louison MRN: [id number] DOB: [birth year]   Cancelled Treatment:    Reason Eval/Treat Not Completed: Fatigue/lethargy limiting ability to participate; patient wanting to rest right now.  Asked PT to return in "couple of hours." Left pager number with sitter to call if ready to get up before then.  Will check back later today.   Ane PaymentCynthia R Mauriah Mcmillen 05/21/2013, 12:10 PM

## 2013-05-21 NOTE — Evaluation (Signed)
Physical Therapy Evaluation Patient Details Name: Gerhard PerchesKeith Eberlin MRN: [id number] DOB: 14-Nov-1955 Today's Date: 05/21/2013   History of Present Illness  [name] is a 58 y.o. M with PMH history of prior left pneumothorax after a fall which required chest tube placement. Admitted for spontaneous pneumothorax to cVTs service 05/10/13 and Rx with chest tube and discharged 05/13/13. Now presented on 4/20 with AMS.  A mailman found him lying in the yard and dispatched EMS.  Clinical Impression  Patient presents with decreased safety and imbalance at risk for falls with Dynamic Gait Index score of 15/24 (scores 19 or less predictive of falls in community living adults.)  He will benefit from skilled PT in the acute setting to allow decreased fall risk.  Will need SNF rehab at d/c as does not have 24 hour supervision.    Follow Up Recommendations SNF;Supervision/Assistance - 24 hour    Equipment Recommendations  Cane    Recommendations for Other Services       Precautions / Restrictions Precautions Precautions: Fall      Mobility  Bed Mobility Overal bed mobility: Modified Independent                Transfers Overall transfer level: Needs assistance   Transfers: Sit to/from Stand Sit to Stand: Supervision         General transfer comment: for safety due to fall risk  Ambulation/Gait Ambulation/Gait assistance: Min guard;Supervision Ambulation Distance (Feet): 250 Feet Assistive device: None Gait Pattern/deviations: Step-through pattern;Drifts right/left   Gait velocity interpretation: >2.62 ft/sec, indicative of independent community ambulator General Gait Details: demonstrates decreased safety with veering from straight path with head turns  Information systems managertairs            Wheelchair Mobility    Modified Rankin (Stroke Patients Only)       Balance                                 Standardized Balance Assessment Standardized Balance Assessment : Dynamic  Gait Index   Dynamic Gait Index Level Surface: Mild Impairment Change in Gait Speed: Mild Impairment Gait with Horizontal Head Turns: Mild Impairment Gait with Vertical Head Turns: Mild Impairment Gait and Pivot Turn: Moderate Impairment Step Over Obstacle: Mild Impairment Step Around Obstacles: Mild Impairment Steps: Mild Impairment Total Score: 15       Pertinent Vitals/Pain Min c/o pain in hands    Home Living Family/patient expects to be discharged to:: Skilled nursing facility Living Arrangements: Alone   Type of Home: House Home Access: Stairs to enter Entrance Stairs-Rails: None Entrance Stairs-Number of Steps: 2 Home Layout: One level Home Equipment: None      Prior Function Level of Independence: Independent         Comments: 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, waterCDL driver     Hand Dominance        Extremity/Trunk Assessment               Lower Extremity Assessment: Overall WFL for tasks assessed         Communication   Communication: No difficulties  Cognition Arousal/Alertness: Awake/alert   Overall Cognitive Status: Impaired/Different from baseline Area of Impairment: Orientation;Awareness;Safety/judgement Orientation Level: Time;Situation       Safety/Judgement: Decreased awareness of safety;Decreased awareness of deficits          General Comments General comments (skin integrity, edema, etc.): noted hands with errythema and c/o pain; bruising over right  eye, laceration at bridge of nose    Exercises        Assessment/Plan    PT Assessment Patient needs continued PT services  PT Diagnosis Abnormality of gait;Altered mental status   PT Problem List Decreased balance;Decreased knowledge of use of DME;Decreased safety awareness;Decreased mobility  PT Treatment Interventions DME instruction;Gait training;Stair training;Functional mobility training;Therapeutic activities;Patient/family education;Balance training;Therapeutic  exercise;Neuromuscular re-education   PT Goals (Current goals can be found in the Care Plan section) Acute Rehab PT Goals Patient Stated Goal: None stated; per chart sister wants placement PT Goal Formulation: Patient unable to participate in goal setting Time For Goal Achievement: 06/04/13 Potential to Achieve Goals: Fair    Frequency Min 3X/week   Barriers to discharge Decreased caregiver support lives alone    Co-evaluation               End of Session Equipment Utilized During Treatment: Gait belt Activity Tolerance: Patient tolerated treatment well Patient left: in bed;with call bell/phone within reach;with nursing/sitter in room           Time: 1555-1610 PT Time Calculation (min): 15 min   Charges:   PT Evaluation $Initial PT Evaluation Tier I: 1 Procedure PT Treatments $Gait Training: 8-22 mins   PT G CodesAne Payment:          Willford Rabideau R Jerrett Baldinger 05/21/2013, 4:43 PM Sheran Lawlessyndi Arkie Tagliaferro, PT [phone] 05/21/2013

## 2013-05-21 NOTE — Progress Notes (Signed)
[facility] PROGRESS NOTE  [name] PerchesKeith [name] ZOX:[id number] DOB: 01-10-1956 DOA: 05/19/2013 PCP: No primary provider on file.  Assessment/Plan: 1. Significant Hyponatremia -   - NA level 108 on admission  -continued AMS with no recollection of events  -patients reports consuming a large amount of water, soda, and Etoh daily this is the most likely cause of his   -Hyponatremia  (Beer potomania and polydipsia)  -There was concern for overly rapid correction.  Still monitoring bmets q 6 hours.    -saline lock IVF, restricted fluid intake to 1200, regular diet. Allowing the sodium to correct itself.   2. Acute on chronic encephalopathy  -likely due to the severe hyponatremia.  Given polydipsia, also concerned for underlying psychiatric illness  -consulted Psychiatry.  -Patient got out of bed and ran off the unit 4/22 pm.  Found on 4N.  -removed restraints but continued sitter in room, if sitter is d/c consider re-applying the restraints  -Concerned about disposition.  At this point patient is unable to live at home alone.     -Per [name] BattiestBrenda [name] sister:  Patient was away from the family for years.  He returned approximately 1 year ago and has been very confused since.  He has no concept that he has no money.  He was living alone with no power, heat or food in the home.  He believed his mother was dead and repeatedly asked for her life insurance money.  (His mother is alive).   In October, his sister arranged for him to live in her boyfriend's home (because it had heat and power).  She has checked on him 3x week since.  She buys all of his grocery's.  He has had no alcohol since October.  He has no power of attorney.  His sister is in agreement that he will need to be placed.  She is unable to continue to care for him.     3. Tobacco abuse  -Patient complains of nicotine cravings,  -continue nicotine patches  4. Leukocytosis  -wbc 13.2 on 4/23 (Overall trend is down)  -blood cultures NTGD,  afebrile, reaction from hyponatremia?  -Urine analysis negative on 4/20  -recheck CBC in AM   Code Status: full  Family Communication:  Called sister, [name] [name] Disposition Plan: inpatient    Consultants:  psychiatry  Procedures:  none  Antibiotics:  none  HPI [name] is a 58 year old male with unknown medical history. A mailman found him lying in the yard and dispatched EMS. Upon EMS arrival, pt was very combative and aggressive, he required 7mg  of Versed en route to ED. In ED, initial sodium found to be 108 at 2pm (was 140 on 05/10/13) with repeat of 113 by 4pm (5meq correction in 2 hours). Head CT negative for acute process. Pt remained combative but then fell asleep after he was given some blankets. PCCM was asked to admit the pt to the ICU for further evaluation.    Objective: Filed Vitals:   05/21/13 0449  BP: 153/83  Pulse: 80  Temp: 97.9 F (36.6 C)  Resp: 16    Intake/Output Summary (Last 24 hours) at 05/21/13 1131 Last data filed at 05/21/13 1126  Gross per 24 hour  Intake    740 ml  Output   1335 ml  Net   -595 ml   There were no vitals filed for this visit.  Exam:  Physical Exam  Constitutional: He appears well-developed and well-nourished. No distress.  HENT:  Head:  Normocephalic and atraumatic.  Eyes: Pupils are equal, round, and reactive to light.  Neck: No JVD present.  Cardiovascular: Normal rate, regular rhythm and normal heart sounds.  Exam reveals no gallop and no friction rub.   No murmur heard. Respiratory: Effort normal and breath sounds normal. No stridor. No respiratory distress.  Lymphadenopathy:    He has no cervical adenopathy.  Neurological: He is alert. No cranial nerve deficit.  Skin: Skin is warm and dry. He is not diaphoretic.  Psychiatric: Thought content normal. His speech is slurred. He is slowed. He exhibits abnormal recent memory.  Overall confused, unaware of surroundings, unable to recall recent events.    He  is inattentive.    Data Reviewed: Basic Metabolic Panel:  Recent Labs Lab 05/18/13 1550 05/18/13 2140  05/19/13 0530  05/20/13 0415 05/20/13 1200 05/20/13 2012 05/21/13 0141 05/21/13 0800  NA 113* 119*  < > 128*  < > 127* 123* 121* 122* 127*  K 4.3 4.2  < > 3.4*  < > 3.3* 3.5* 3.8 3.8 3.8  CL 73* 83*  < > 90*  < > 91* 88* 86* 86* 90*  CO2 25 22  < > 23  < > 22 23 20 22 25   GLUCOSE 99 93  < > 151*  < > 121* 107* 104* 91 93  BUN 3* 3*  < > 3*  < > 3* <3* <3* <3* <3*  CREATININE 0.54 0.53  < > 0.57  < > 0.58 0.57 0.54 0.57 0.55  CALCIUM 9.2 8.6  < > 8.6  < > 8.5 8.5 8.5 8.6 9.0  MG  --  1.5  --  1.9  --  2.0  --   --   --   --   PHOS  --  2.7  --  2.6  --  2.3  --   --   --   --   < > = values in this interval not displayed. Liver Function Tests:  Recent Labs Lab 05/18/13 1411 05/19/13 0530  AST 59* 41*  ALT 18 14  ALKPHOS 66 57  BILITOT 0.8 0.6  PROT 6.4 5.6*  ALBUMIN 3.4* 2.9*    Recent Labs Lab 05/19/13 1117  AMMONIA 22   CBC:  Recent Labs Lab 05/18/13 1411 05/19/13 0530 05/19/13 1117 05/20/13 0415 05/21/13 0141  WBC 17.2* 9.5 14.2* 9.0 13.2*  HGB 14.0 12.9* 12.8* 12.2* 11.5*  HCT 36.6* 34.9* 34.7* 33.0* 31.1*  MCV 82.1 83.1 83.6 85.1 84.5  PLT 361 350 391 298 293   Cardiac Enzymes:  Recent Labs Lab 05/18/13 1411 05/19/13 0530 05/20/13 0415  CKTOTAL 2052* 1048* 503*  CKMB  --  11.6*  --   TROPONINI <0.30 <0.30  --    CBG:  Recent Labs Lab 05/18/13 1406 05/18/13 1851  GLUCAP 103* 100*    Recent Results (from the past 240 hour(s))  CULTURE, BLOOD (ROUTINE X 2)     Status: None   Collection Time    05/18/13  5:01 PM      Result Value Ref Range Status   Specimen Description BLOOD ARM LEFT   Final   Special Requests BOTTLES DRAWN AEROBIC AND ANAEROBIC 10CC   Final   Culture  Setup Time     Final   Value: 05/18/2013 22:40     Performed at [facility]   Culture     Final   Value:        BLOOD CULTURE RECEIVED NO  GROWTH TO  DATE CULTURE WILL BE HELD FOR 5 DAYS BEFORE ISSUING A FINAL NEGATIVE REPORT     Performed at [facility]   Report Status PENDING   Incomplete  CULTURE, BLOOD (ROUTINE X 2)     Status: None   Collection Time    05/18/13  5:30 PM      Result Value Ref Range Status   Specimen Description BLOOD HAND LEFT   Final   Special Requests BOTTLES DRAWN AEROBIC ONLY [facility]   Final   Culture  Setup Time     Final   Value: 05/18/2013 22:40     Performed at [facility]   Culture     Final   Value:        BLOOD CULTURE RECEIVED NO GROWTH TO DATE CULTURE WILL BE HELD FOR 5 DAYS BEFORE ISSUING A FINAL NEGATIVE REPORT     Performed at [facility]   Report Status PENDING   Incomplete     Studies: Dg Chest Port 1 View  05/19/2013   CLINICAL DATA:  Central line placement.  EXAM: PORTABLE CHEST - 1 VIEW  COMPARISON:  None.  FINDINGS: Left IJ catheter is in place with the tip projecting over the lower superior vena cava. There is no pneumothorax. Lungs are clear. Heart size is normal.  IMPRESSION: Left IJ catheter tip projects over the lower superior vena cava. Negative for pneumothorax.  Lungs clear.   Electronically Signed   By: Drusilla Kannerhomas  Dalessio M.D.   On: 05/19/2013 15:50    Scheduled Meds: . enoxaparin (LOVENOX) injection  40 mg Subcutaneous Q24H  . folic acid  1 mg Oral Daily  . multivitamin with minerals  1 tablet Oral Daily  . nicotine  21 mg Transdermal Daily  . thiamine  100 mg Oral Daily   Or  . thiamine  100 mg Intravenous Daily   Continuous Infusions:   Active Problems:   Hyponatremia   Altered mental status   Alcohol abuse   Tobacco abuse   Time spent: [address]  [name] Alan Trevor MaceBenda, Student-PA [name] [name], [place]-C [phone] [facility]  If 7PM-7AM, please contact night-coverage at [email or web address], password [email or web address] 05/21/2013, 11:31 AM  LOS: 2 days

## 2013-05-21 NOTE — Progress Notes (Signed)
Unreliable historian, but claimed that he drinks almost a gallon of water on a daily basis along with 2 L of soda and a sixpack of beer. Brought to the ICU with altered mental status and the sodium level of 108. Osmolality is significantly decreased at 30 on 4/20, likely secondary to significant excessive fluid intake. Now on a fluid restriction, sodium has come up to 127. Continue with current measures as outlined. Agree with the above assessment and plan.

## 2013-05-21 NOTE — Clinical Social Work Placement (Addendum)
Clinical Social Work Department CLINICAL SOCIAL WORK PLACEMENT NOTE 05/21/2013  Patient:  [name] [name],[name] [name]  Account Number:  [id number] Admit date:  05/19/2013  Clinical Social Worker:  Cherre BlancJOSEPH BRYANT [name], [place]  Date/time:  05/21/2013 06:55 PM  Clinical Social Work is seeking post-discharge placement for this patient at the following level of care:   SKILLED NURSING   (*CSW will update this form in Epic as items are completed)   05/21/2013  Patient/family provided with [facility] Department of Clinical Social Work's list of facilities offering this level of care within the geographic area requested by the patient (or if unable, by the patient's family).  05/21/2013  Patient/family informed of their freedom to choose among providers that offer the needed level of care, that participate in Medicare, Medicaid or managed care program needed by the patient, have an available bed and are willing to accept the patient.  05/21/2013  Patient/family informed of [facility]' ownership interest in [facility], as well as of the fact that they are under no obligation to receive care at this facility.  PASARR submitted to EDS on 05/21/2013 PASARR number received from EDS on 05/21/2013  FL2 transmitted to all facilities in geographic area requested by pt/family on  05/21/2013 FL2 transmitted to all facilities within larger geographic area on   Patient informed that his/her managed care company has contracts with or will negotiate with  certain facilities, including the following:     Patient/family informed of bed offers received:  05/25/2013 [facility]ician recommends and patient chooses bed at    [facility]  on  05/25/2013 Patient to be transferred to facility by [facility]  The following physician request were entered in Epic:   Additional Comments:    [name] [name] MSW, [place], [place], [phone]

## 2013-05-21 NOTE — Progress Notes (Signed)
Pt got out of 2 point restraints and went off unit and was found on 4N. On call MD was notified and orders were given for wrist and waist restraints to be continued. Restraints were placed on pt and bed alarm turned on. Mitts were also placed on pt to help prevent pt from untying restraints. AC called for Recruitment consultantsafety sitter. Safety sitter available from 2300-0300. Will continue to monitor pt.

## 2013-05-22 ENCOUNTER — Encounter (HOSPITAL_COMMUNITY): Payer: Self-pay | Admitting: General Practice

## 2013-05-22 LAB — CBC
HCT: 33.6 % — ABNORMAL LOW (ref 39.0–52.0)
Hemoglobin: 12.1 g/dL — ABNORMAL LOW (ref 13.0–17.0)
MCH: 30.9 pg (ref 26.0–34.0)
MCHC: 36 g/dL (ref 30.0–36.0)
MCV: 85.7 fL (ref 78.0–100.0)
PLATELETS: 377 10*3/uL (ref 150–400)
RBC: 3.92 MIL/uL — AB (ref 4.22–5.81)
RDW: 13.7 % (ref 11.5–15.5)
WBC: 9.1 10*3/uL (ref 4.0–10.5)

## 2013-05-22 LAB — BASIC METABOLIC PANEL
BUN: 4 mg/dL — ABNORMAL LOW (ref 6–23)
CHLORIDE: 93 meq/L — AB (ref 96–112)
CO2: 22 meq/L (ref 19–32)
Calcium: 8.7 mg/dL (ref 8.4–10.5)
Creatinine, Ser: 0.62 mg/dL (ref 0.50–1.35)
GFR calc non Af Amer: >90 mL/min (ref >90)
Glucose, Bld: 103 mg/dL — ABNORMAL HIGH (ref 70–99)
Potassium: 3.7 mEq/L (ref 3.7–5.3)
SODIUM: 130 meq/L — AB (ref 137–147)

## 2013-05-22 MED ORDER — THIAMINE HCL 100 MG PO TABS: 100.0000 mg | ORAL_TABLET | Freq: Every day | ORAL | Status: AC

## 2013-05-22 MED ORDER — NICOTINE 21 MG/24HR TD PT24: 21.0000 mg | MEDICATED_PATCH | Freq: Every day | TRANSDERMAL | Status: DC

## 2013-05-22 MED ORDER — ADULT MULTIVITAMIN W/MINERALS CH: 1.0000 | ORAL_TABLET | Freq: Every day | ORAL | Status: AC

## 2013-05-22 MED ORDER — DOXYCYCLINE HYCLATE 100 MG PO TABS
100.0000 mg | ORAL_TABLET | Freq: Two times a day (BID) | ORAL | Status: DC
  Administered 2013-05-23 – 2013-05-25 (×4): 100 mg via ORAL
  Filled 2013-05-22 (×7): qty 1

## 2013-05-22 MED ORDER — LORAZEPAM 2 MG/ML IJ SOLN
2.0000 mg | Freq: Once | INTRAMUSCULAR | Status: AC
  Administered 2013-05-22: 2 mg via INTRAMUSCULAR
  Filled 2013-05-22: qty 1

## 2013-05-22 MED ORDER — RISPERIDONE 0.5 MG PO TABS: 0.5000 mg | ORAL_TABLET | Freq: Every day | ORAL | Status: DC

## 2013-05-22 MED ORDER — RISPERIDONE 0.5 MG PO TABS
0.5000 mg | ORAL_TABLET | Freq: Every day | ORAL | Status: DC
Start: 2013-05-22 — End: 2013-05-25
  Administered 2013-05-24: 0.5 mg via ORAL
  Filled 2013-05-22 (×4): qty 1

## 2013-05-22 MED ORDER — FOLIC ACID 1 MG PO TABS: 1.0000 mg | ORAL_TABLET | Freq: Every day | ORAL | Status: DC

## 2013-05-22 NOTE — Progress Notes (Addendum)
CSW (Clinical Child psychotherapistocial Worker) called pt sister Meriam SpragueBeverly and explained SNF search process. Pt sister aware that because of pt payer source [facility]. CSW has called multiple facilities to inquire about bed offers but pt currently does not have any offers. CSW to continue to call facilities and try to arrange placement for potential dc tomorrow.   ADDENDUM: CSW continues to work on placement for pt. [facility].    Alin Chavira, LCSWA [phone]

## 2013-05-22 NOTE — Progress Notes (Addendum)
Patient educated about 151500ml/day fluid restriction. Patient is frequently asking for water, soda, and coffee. Patient found drinking water from sink in room. Education provided, but patient is nonadherent to orders stating, "I'm thirsty so I'm going to drink!." MD notified. Orders received. Patient found with no peripheral IV in right forearm. Peripheral IV seen in trash barrel in bedroom. When asked, patient replied, "I took it out because I don't need it." MD aware. Orders received. Will continue to monitor.   Patient brought out to nurses desk. Patient redirected by conversing and playing songs of patients choice. Will continue to monitor.

## 2013-05-22 NOTE — Progress Notes (Signed)
[facility] PROGRESS NOTE  [name] [name] WUJ:[id number] DOB: 1955-04-05 DOA: 05/19/2013 PCP: No primary provider on file.  Assessment/Plan: 1. Significant Hyponatremia -   - NA level 108 on admission  -patient is a completely unreliable historian but reports consuming a large amount of water and soda daily.  Polydipsia.  -Patient was initially treated with NS IVF and there was concern for overly rapid correction.  -As of 4/23 - restricted fluid intake to 1200, regular diet. Allowing the sodium to correct itself.  -Sodium is 130 on 4/24.  2. Acute on chronic encephalopathy  -likely due to the severe hyponatremia.  Given polydipsia, also concerned for underlying psychiatric illness  -appreciate psychiatry consultation.  -removed restraints 4/23.  Discontinued bedside sitter on 4/24.    -Per psychiatry he does not have capacity to participate in his treatment plan and is unable to care for himself.  -Risperdal 0.5 mg qhs was started by psychiatry to help psychosis and paranoid delusions.  3. Tobacco abuse  -Patient complains of nicotine cravings,  -continue nicotine patches  4. Leukocytosis  -wbc has normalized.  -blood cultures NTGD, afebrile  -Urine analysis negative on 4/20  -recheck CBC in AM   Code Status: full  Family Communication:   Disposition Plan: inpatient.  D/C to SNF when bed is available.   Consultants:  psychiatry  Procedures:  none  Antibiotics:  none  HPI [name] [name] is a 58 year old male with unknown medical history. A mailman found him lying in the yard and dispatched EMS. Upon EMS arrival, pt was very combative and aggressive, he required 7mg  of Versed en route to ED. In ED, initial sodium found to be 108 at 2pm (was 140 on 05/10/13) with repeat of 113 by 4pm ( correction in 2 hours). Head CT negative for acute process. Pt remained combative but then fell asleep after he was given some blankets. PCCM was asked to admit the pt to the ICU for  further evaluation.    Objective: Filed Vitals:   05/22/13 0538  BP: 159/86  Pulse: 97  Temp: 99.5 F (37.5 C)  Resp: 18    Intake/Output Summary (Last 24 hours) at 05/22/13 1157 Last data filed at 05/22/13 1038  Gross per 24 hour  Intake   1662 ml  Output    450 ml  Net   1212 ml   There were no vitals filed for this visit.  Exam:  Physical Exam  Constitutional: He appears well-developed and well-nourished. No distress.  HENT:  Head: Normocephalic and atraumatic.  Eyes: Pupils are equal, round, and reactive to light.  Neck: No JVD present.  Cardiovascular: Normal rate, regular rhythm and normal heart sounds.  Exam reveals no gallop and no friction rub.   No murmur heard. Respiratory: Effort normal and breath sounds normal. No stridor. No respiratory distress.  Lymphadenopathy:    He has no cervical adenopathy.  Neurological: He is alert. No cranial nerve deficit.  Skin: Skin is warm and dry. He is not diaphoretic.  Psychiatric: Thought content normal. His speech is slurred. He is slowed. He exhibits abnormal recent memory.  Overall confused, unaware of surroundings, unable to recall recent events.    He is inattentive.    Data Reviewed: Basic Metabolic Panel:  Recent Labs Lab 05/18/13 1550 05/18/13 2140  05/19/13 0530  05/20/13 0415  05/20/13 2012 05/21/13 0141 05/21/13 0800 05/21/13 1400 05/22/13 0630  NA 113* 119*  < > 128*  < > 127*  < > 121* 122*  127* 131* 130*  K 4.3 4.2  < > 3.4*  < > 3.3*  < > 3.8 3.8 3.8 3.8 3.7  CL 73* 83*  < > 90*  < > 91*  < > 86* 86* 90* 95* 93*  CO2 25 22  < > 23  < > 22  < > 20 22 25 24 22   GLUCOSE 99 93  < > 151*  < > 121*  < > 104* 91 93 98 103*  BUN 3* 3*  < > 3*  < > 3*  < > <3* <3* <3* 3* 4*  CREATININE 0.54 0.53  < > 0.57  < > 0.58  < > 0.54 0.57 0.55 0.60 0.62  CALCIUM 9.2 8.6  < > 8.6  < > 8.5  < > 8.5 8.6 9.0 9.0 8.7  MG  --  1.5  --  1.9  --  2.0  --   --   --   --   --   --   PHOS  --  2.7  --  2.6  --  2.3   --   --   --   --   --   --   < > = values in this interval not displayed. Liver Function Tests:  Recent Labs Lab 05/18/13 1411 05/19/13 0530  AST 59* 41*  ALT 18 14  ALKPHOS 66 57  BILITOT 0.8 0.6  PROT 6.4 5.6*  ALBUMIN 3.4* 2.9*    Recent Labs Lab 05/19/13 1117  AMMONIA 22   CBC:  Recent Labs Lab 05/19/13 0530 05/19/13 1117 05/20/13 0415 05/21/13 0141 05/22/13 0630  WBC 9.5 14.2* 9.0 13.2* 9.1  HGB 12.9* 12.8* 12.2* 11.5* 12.1*  HCT 34.9* 34.7* 33.0* 31.1* 33.6*  MCV 83.1 83.6 85.1 84.5 85.7  PLT 350 391 298 293 377   Cardiac Enzymes:  Recent Labs Lab 05/18/13 1411 05/19/13 0530 05/20/13 0415  CKTOTAL 2052* 1048* 503*  CKMB  --  11.6*  --   TROPONINI <0.30 <0.30  --    CBG:  Recent Labs Lab 05/18/13 1406 05/18/13 1851  GLUCAP 103* 100*    Recent Results (from the past 240 hour(s))  CULTURE, BLOOD (ROUTINE X 2)     Status: None   Collection Time    05/18/13  5:01 PM      Result Value Ref Range Status   Specimen Description BLOOD ARM LEFT   Final   Special Requests BOTTLES DRAWN AEROBIC AND ANAEROBIC 10CC   Final   Culture  Setup Time     Final   Value: 05/18/2013 22:40     Performed at [facility]   Culture     Final   Value:        BLOOD CULTURE RECEIVED NO GROWTH TO DATE CULTURE WILL BE HELD FOR 5 DAYS BEFORE ISSUING A FINAL NEGATIVE REPORT     Performed at [facility]   Report Status PENDING   Incomplete  CULTURE, BLOOD (ROUTINE X 2)     Status: None   Collection Time    05/18/13  5:30 PM      Result Value Ref Range Status   Specimen Description BLOOD HAND LEFT   Final   Special Requests BOTTLES DRAWN AEROBIC ONLY [facility]9CC   Final   Culture  Setup Time     Final   Value: 05/18/2013 22:40     Performed at [facility]   Culture     Final  Value:        BLOOD CULTURE RECEIVED NO GROWTH TO DATE CULTURE WILL BE HELD FOR 5 DAYS BEFORE ISSUING A FINAL NEGATIVE REPORT     Performed at [facility]   Report  Status PENDING   Incomplete     Studies: No results found.  Scheduled Meds: . enoxaparin (LOVENOX) injection  40 mg Subcutaneous Q24H  . folic acid  1 mg Oral Daily  . multivitamin with minerals  1 tablet Oral Daily  . nicotine  21 mg Transdermal Daily  . risperiDONE  0.5 mg Oral QHS  . thiamine  100 mg Oral Daily   Or  . thiamine  100 mg Intravenous Daily   Continuous Infusions:   Active Problems:   Hyponatremia   Altered mental status   Alcohol abuse   Tobacco abuse   Time spent: 60 min.   Stephani PoliceMarianne L York, [place]PA-C [phone] [facility]  If 7PM-7AM, please contact night-coverage at [email or web address], password [email or web address] 05/22/2013, 11:57 AM  LOS: 3 days     Attending Seen and examined, agree with the above assessment and plan. Sodium stable at 130 today, we have discontinued restraints and sister, awaiting SNF bed. Rest of his issues are stable and as outlined above.  Windell NorfolkS Stillman Buenger MD

## 2013-05-22 NOTE — Discharge Summary (Addendum)
Physician Discharge Summary  [name] PerchesKeith [name] GNF:[id number] DOB: [birth year] DOA: 05/19/2013  PCP: No PCP Per Patient  Admit date: 05/19/2013 Discharge date: 05/25/2013  Time spent: 45 minutes  Recommendations for Outpatient Follow-up:  1. Check BMET 2-3 days post discharge to monitor serum sodium. 2. Reasonable daily fluid restriction- around 1500 cc a day. 3. Recommend psychiatry follow up out either as outpatient or while at skilled nursing facility for further optimization of medication.  Discharge Diagnoses:  Active Problems:   Hyponatremia   Altered mental status   Alcohol abuse   Tobacco abuse   Discharge Condition: stable.  Diet recommendation: regular diet with a fluid restriction of 1500 ml/day.  History of present illness:  [name] [name] is a 58 yo male with minimal documented past medical history.  He was found unconscious in his yard by the mailman.  Upon EMS arrival, pt was very combative and aggressive, he required 7mg  of Versed en route to ED. In 2 hours). Head CT negative for acute process.  The patient was admitted to the Intensive Care Unit.  Hospital Course:   1. Significant Hyponatremia -  -NA level 108 on admission likely due to polydipsia. Patient was admitted to the intensive care unit by the pulmonary critical care team, and was provided with supportive care and IV fluids. Sodium levels were closely followed, sodium levels have slowly improved and now have corrected by the day of discharge. At one point while in the intensive care unit, patient's sodium rapidly corrected, patient was given DDAVP, he is currently stable, has no signs or symptoms of neurological deficits. It is thought that excessive water intake along with alcohol played a big role in patient's hyponatremia. His urine osmole out he was significantly decreased at 30.. It is thought that excessive water intake along with alcohol played a big role in patient's hyponatremia. His urine osmolality was  significantly decreased at 30. From talking with patient's sister on 4/25, they do confirm a lot of daily fluid intake, although she denies Etoh use.Na has now normalized, will continue to monitor periodically, continue with fluid restriction.  -Patient is a completely unreliable historian but reports consuming a large amount of water and soda daily. He has a history of alcoholism although his sister reports he has not consumed alcohol since 10/2012.  2. Acute on chronic encephalopathy  -likely due to the severe hyponatremia. Given polydipsia, also concerned for underlying psychiatric illness. CT scan of the head was negative for acute abnormalities. Mental status has slowly improved, however he was evaluated by psychiatry and deemed not to have capacity. 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 Currently he is calm, quiet. He appears very anxious at times and has been asking the nursing staff for allowing water beyond his fluid restriction. He will be started on low-dose Klonopin in the time of discharge. - Please note, TSH was in normal limits. RPR and HIV were negative. Urine drug screen was positive for benzodiazepines.  3. Tobacco abuse  -continue daily nicotine patches   4. Leukocytosis  - Resolved. - Uncertain etiology. - wbc has normalized.  - blood cultures NTGD, afebrile  -Urine analysis negative on 4/20   5. Mild thrombophlebitis of right hand - Secondary to infiltration of IV site, looks stable with mild erythema, have encouraged him/cold compresses, have placed on doxycycline for 5 days. No signs of any worsening overnight, it looks stable without any fluctuation. He is able  to make a fist, no tenderness on passive extension and flexion of the wrist or fingers.  Procedures:  LIJ CVC placement 4/21  Consultations:  Psychiatry  Discharge Exam: Filed Vitals:   05/25/13 1402  BP:  135/91  Pulse: 70  Temp: 97.7 F (36.5 C)  Resp: 16   Physical Exam  Constitutional: He appears well-developed, average weight, grey complected, smiling. Head: Normocephalic and atraumatic.  Eyes: Pupils are equal, round Neck: No JVD present.  Cardiovascular: Normal rate, regular rhythm and normal heart sounds. Exam reveals no gallop and no friction rub.  No murmur heard.  Respiratory: Effort normal and breath sounds normal. No stridor. No respiratory distress.  Neurological: He is alert. No cranial nerve deficit.  Skin: warm and dry. He is not diaphoretic.  Neurology: Awake, alert, follows commands   Discharge Instructions       Discharge Orders   Future Orders Complete By Expires   Call MD for:  extreme fatigue  As directed    Call MD for:  persistant dizziness or light-headedness  As directed    Diet - low sodium heart healthy  As directed    Diet general  As directed    Scheduling Instructions:   1500 cc of fluid restriction daily.   Increase activity slowly  As directed    Increase activity slowly  As directed        Medication List         acetaminophen 500 MG tablet  Commonly known as:  TYLENOL  Take 500 mg by mouth every 6 (six) hours as needed.     clonazePAM 0.5 MG tablet  Commonly known as:  KLONOPIN  Take 1 tablet (0.5 mg total) by mouth 2 (two) times daily.     doxycycline 100 MG tablet  Commonly known as:  VIBRA-TABS  Take 1 tablet (100 mg total) by mouth every 12 (twelve) hours. Stop on 4/29.     folic acid 1 MG tablet  Commonly known as:  FOLVITE  Take 1 tablet (1 mg total) by mouth daily.     multivitamin with minerals Tabs tablet  Take 1 tablet by mouth daily.     nicotine 21 mg/24hr patch  Commonly known as:  NICODERM CQ - dosed in mg/24 hours  Place 1 patch (21 mg total) onto the skin daily.     risperiDONE 0.5 MG tablet  Commonly known as:  RISPERDAL  Take 1 tablet (0.5 mg total) by mouth at bedtime.     thiamine 100 MG tablet   Take 1 tablet (100 mg total) by mouth daily.       No Known Allergies    The results of significant diagnostics from this hospitalization (including imaging, microbiology, ancillary and laboratory) are listed below for reference.    Significant Diagnostic Studies: Dg Chest 1 View  05/18/2013   CLINICAL DATA:  Altered mental status.  Unresponsive patient.  EXAM: CHEST - 1 VIEW  COMPARISON:  DG CHEST 2 VIEW dated 05/13/2013  FINDINGS: Inferior half of the chest is excluded from view. The patient was noncompliant with imaging.  The cardiopericardial silhouette and mediastinal contours appear within normal limits. No airspace disease or effusion. No pneumothorax identified. Monitoring leads project over the chest.  IMPRESSION: 1. No visible acute cardiopulmonary disease. 2. Lung bases are cut off due to patient non compliance with imaging.   Electronically Signed   By: Andreas NewportGeoffrey  Lamke M.D.   On: 05/18/2013 15:15   Ct Head Wo Contrast  05/18/2013   CLINICAL DATA:  Fall, with pain and injury.  Neck pain.  Head pain.  EXAM: CT HEAD WITHOUT CONTRAST  CT CERVICAL SPINE WITHOUT CONTRAST  TECHNIQUE: Multidetector CT imaging of the head and cervical spine was performed following the standard protocol without intravenous contrast. Multiplanar CT image reconstructions of the cervical spine were also generated.  COMPARISON:  None.  FINDINGS: CT HEAD FINDINGS  No evidence for acute infarction, hemorrhage, mass lesion, hydrocephalus, or extra-axial fluid. There is mild atrophy. Mild chronic microvascular ischemic change is noted. Mild carotid siphon vascular calcification is noted. Calvarium is intact. There is no acute sinus or mastoid disease.  CT CERVICAL SPINE FINDINGS  There is no visible cervical spine fracture, traumatic subluxation, prevertebral soft tissue swelling, or intraspinal hematoma. Advanced disc space narrowing at C5-6 and C6-7 with mild straightening of the cervical lordosis. Multilevel facet  arthropathy. Bilateral foraminal narrowing at C5-6 and C6-7. Lung apices show mild scarring. Airway midline. Carotid atherosclerosis.  IMPRESSION: Mild atrophy and small vessel disease. No acute intracranial findings.  Cervical spondylosis.  No fracture or traumatic subluxation.   Electronically Signed   By: Davonna Belling M.D.   On: 05/18/2013 16:36   Ct Cervical Spine Wo Contrast  05/18/2013   CLINICAL DATA:  Fall, with pain and injury.  Neck pain.  Head pain.  EXAM: CT HEAD WITHOUT CONTRAST  CT CERVICAL SPINE WITHOUT CONTRAST  TECHNIQUE: Multidetector CT imaging of the head and cervical spine was performed following the standard protocol without intravenous contrast. Multiplanar CT image reconstructions of the cervical spine were also generated.  COMPARISON:  None.  FINDINGS: CT HEAD FINDINGS  No evidence for acute infarction, hemorrhage, mass lesion, hydrocephalus, or extra-axial fluid. There is mild atrophy. Mild chronic microvascular ischemic change is noted. Mild carotid siphon vascular calcification is noted. Calvarium is intact. There is no acute sinus or mastoid disease.  CT CERVICAL SPINE FINDINGS  There is no visible cervical spine fracture, traumatic subluxation, prevertebral soft tissue swelling, or intraspinal hematoma. Advanced disc space narrowing at C5-6 and C6-7 with mild straightening of the cervical lordosis. Multilevel facet arthropathy. Bilateral foraminal narrowing at C5-6 and C6-7. Lung apices show mild scarring. Airway midline. Carotid atherosclerosis.  IMPRESSION: Mild atrophy and small vessel disease. No acute intracranial findings.  Cervical spondylosis.  No fracture or traumatic subluxation.   Electronically Signed   By: Davonna Belling M.D.   On: 05/18/2013 16:36   Dg Chest Port 1 View  05/19/2013   CLINICAL DATA:  Central line placement.  EXAM: PORTABLE CHEST - 1 VIEW  COMPARISON:  None.  FINDINGS: Left IJ catheter is in place with the tip projecting over the lower superior vena  cava. There is no pneumothorax. Lungs are clear. Heart size is normal.  IMPRESSION: Left IJ catheter tip projects over the lower superior vena cava. Negative for pneumothorax.  Lungs clear.   Electronically Signed   By: Drusilla Kanner M.D.   On: 05/19/2013 15:50    Microbiology: Recent Results (from the past 240 hour(s))  CULTURE, BLOOD (ROUTINE X 2)     Status: None   Collection Time    05/18/13  5:01 PM      Result Value Ref Range Status   Specimen Description BLOOD ARM LEFT   Final   Special Requests BOTTLES DRAWN AEROBIC AND ANAEROBIC 10CC   Final   Culture  Setup Time     Final   Value: 05/18/2013 22:40     Performed at [facility]   Culture     Final   Value:        BLOOD CULTURE RECEIVED NO GROWTH TO DATE CULTURE WILL BE HELD FOR 5 DAYS BEFORE ISSUING A FINAL NEGATIVE REPORT     Performed at [facility]   Report Status PENDING   Incomplete  CULTURE, BLOOD (ROUTINE X 2)     Status: None   Collection Time    05/18/13  5:30 PM      Result Value Ref Range Status   Specimen Description BLOOD HAND LEFT   Final   Special Requests BOTTLES DRAWN AEROBIC ONLY 9CC   Final   Culture  Setup Time     Final   Value: 05/18/2013 22:40     Performed at [facility]   Culture     Final   Value:        BLOOD CULTURE RECEIVED NO GROWTH TO DATE CULTURE WILL BE HELD FOR 5 DAYS BEFORE ISSUING A FINAL NEGATIVE REPORT     Performed at [facility]   Report Status PENDING   Incomplete     Labs: Basic Metabolic Panel:  Recent Labs Lab 05/18/13 1550 05/18/13 2140  05/19/13 0530  05/20/13 0415  05/21/13 0141 05/21/13 0800 05/21/13 1400 05/22/13 0630 05/23/13 0726  NA 113* 119*  < > 128*  < > 127*  < > 122* 127* 131* 130* 135*  K 4.3 4.2  < > 3.4*  < > 3.3*  < > 3.8 3.8 3.8 3.7 4.1  CL 73* 83*  < > 90*  < > 91*  < > 86* 90* 95* 93* 96  CO2 25 22  < > 23  < > 22  < > 22 25 24 22 23   GLUCOSE 99 93  < > 151*  < > 121*  < > 91 93 98 103* 94  BUN 3* 3*  < >  3*  < > 3*  < > <3* <3* 3* 4* 5*  CREATININE 0.54 0.53  < > 0.57  < > 0.58  < > 0.57 0.55 0.60 0.62 0.64  CALCIUM 9.2 8.6  < > 8.6  < > 8.5  < > 8.6 9.0 9.0 8.7 9.2  MG  --  1.5  --  1.9  --  2.0  --   --   --   --   --   --   PHOS  --  2.7  --  2.6  --  2.3  --   --   --   --   --   --   < > = values in this interval not displayed. Liver Function Tests:  Recent Labs Lab 05/19/13 0530  AST 41*  ALT 14  ALKPHOS 57  BILITOT 0.6  PROT 5.6*  ALBUMIN 2.9*    Recent Labs Lab 05/19/13 1117  AMMONIA 22   CBC:  Recent Labs Lab 05/19/13 0530 05/19/13 1117 05/20/13 0415 05/21/13 0141 05/22/13 0630  WBC 9.5 14.2* 9.0 13.2* 9.1  HGB 12.9* 12.8* 12.2* 11.5* 12.1*  HCT 34.9* 34.7* 33.0* 31.1* 33.6*  MCV 83.1 83.6 85.1 84.5 85.7  PLT 350 391 298 293 377   Cardiac Enzymes:  Recent Labs Lab 05/19/13 0530 05/20/13 0415  CKTOTAL 1048* 503*  CKMB 11.6*  --   TROPONINI <0.30  --    CBG:  Recent Labs Lab 05/18/13 1851  GLUCAP 100*       Signed:  Tora Kindred  Buelah Manis [phone]  [facility] 05/25/2013, 2:48 PM   Attending Seen and examined, agree with the above assessment and plan, stable, see progress note for today. Stable for discharge to skilled nursing facility today.  Windell Norfolk MD

## 2013-05-22 NOTE — Progress Notes (Signed)
CSW (Clinical Child psychotherapistocial Worker) has sent [facility] and left voicemail for both facilities inquiring about confirmation of bed offer for Saturday discharge. Weekend CSW to follow up.   Betsie Peckman, LCSWA [phone]

## 2013-05-22 NOTE — Progress Notes (Signed)
Patients right hand is red with swelling noted. MD notified. Orders received to apply hot/cold compresses. Will continue to monitor.

## 2013-05-23 LAB — BASIC METABOLIC PANEL
BUN: 5 mg/dL — ABNORMAL LOW (ref 6–23)
CO2: 23 meq/L (ref 19–32)
Calcium: 9.2 mg/dL (ref 8.4–10.5)
Chloride: 96 mEq/L (ref 96–112)
Creatinine, Ser: 0.64 mg/dL (ref 0.50–1.35)
GFR calc Af Amer: >90 mL/min (ref >90)
GLUCOSE: 94 mg/dL (ref 70–99)
POTASSIUM: 4.1 meq/L (ref 3.7–5.3)
SODIUM: 135 meq/L — AB (ref 137–147)

## 2013-05-23 MED ORDER — CLONAZEPAM 0.5 MG PO TABS
0.5000 mg | ORAL_TABLET | Freq: Two times a day (BID) | ORAL | Status: DC
Start: 2013-05-23 — End: 2013-05-25
  Administered 2013-05-23 – 2013-05-25 (×4): 0.5 mg via ORAL
  Filled 2013-05-23 (×5): qty 1

## 2013-05-23 MED ORDER — LORAZEPAM 2 MG/ML IJ SOLN: 2.0000 mg | Freq: Once | INTRAMUSCULAR | Status: DC

## 2013-05-23 MED ORDER — DOXYCYCLINE HYCLATE 100 MG PO TABS: 100.0000 mg | ORAL_TABLET | Freq: Two times a day (BID) | ORAL | Status: DC

## 2013-05-23 MED ORDER — NICOTINE 21 MG/24HR TD PT24: 21.0000 mg | MEDICATED_PATCH | Freq: Every day | TRANSDERMAL | Status: DC

## 2013-05-23 MED ORDER — CLONAZEPAM 0.5 MG PO TABS: 0.5000 mg | ORAL_TABLET | Freq: Two times a day (BID) | ORAL | Status: DC

## 2013-05-23 NOTE — Clinical Social Work Note (Addendum)
CSW informed by RN patient is ready for D/C to SNF. CSW met with patient and family who are agreeable to plans. Patient and family had several questions regarding facilities that are nearby possibly offering a bed. CSW answered questions appropriately. CSW contacted [facility], who stated information could not be reviewed until Monday, 4/27. CSW contacted [facility], who will follow-up on 4/27. CSW contacted [facility], and is pending follow-up from facility. CSW contacted [facility], and is pending follow-up from facility. CSW contacted [facility] who does not currently have LOG beds available. CSW contacted [facility] who does not currently have LOG or Medicaid beds. CSW contacted [facility], and is pending follow-up from facility. CSW contacted [facility] and is pending follow-up from facility. Patient and family made aware. MD also made aware. RN made aware. CSW to continue to follow with d/c plan.   3:08 pm-CSW spoke with the following facilities:  1. [facility]- no LOG beds available.   2. [facility]-no LOG beds available.   3. [facility]- unable to accept patient.   Patrick-Jefferson, LCSWA Weekend Clinical Social Worker [phone]   

## 2013-05-23 NOTE — Progress Notes (Addendum)
Patient on 1512m fluid restriction which has already been met. Patient continues to request water and drinks and becoming agitated when drinks not received.  Educated patient on the importance of the 1500 cc fluid restriction and patient continued to refused. Patient given one time dose of 279mIM ativan to help him relax per patient has no IV access and refused PO.

## 2013-05-23 NOTE — Progress Notes (Signed)
Patient continues to be relaxed and resting well. Will continue to monitor.

## 2013-05-23 NOTE — Progress Notes (Signed)
Patient sleeping well. Will continue to monitor.

## 2013-05-24 NOTE — Progress Notes (Signed)
Patient refused nighttime medications stating that he wanted to wait for the doctor in the morning to tell him that he had to take these medications: risperdal, klonopin and doxycycline. RN educated patient that the doctor is who prescribed the medications for him, patient still refusing. Claiborne Billingsallahan, NP notified that patient is refusing medications. Will continue to monitor patient.

## 2013-05-24 NOTE — Progress Notes (Signed)
PATIENT DETAILS Name: [name] [name] Age: 58 y.o. Sex: male Date of Birth: July 29, 1955 Admit Date: 05/19/2013 Admitting Physician Kalman ShanMurali Ramaswamy, MD PCP:No PCP Per Patient  Subjective: No major complatins  Assessment/Plan:  Significant Hyponatremia -  -NA level 108 on admission likely due to polydipsia. Patient was admitted to the intensive care unit by the pulmonary critical care team, and was provided with supportive care and IV fluids. Sodium levels were closely followed, sodium levels have slowly improved and now have corrected by the day of discharge. At one point while in the intensive care unit, patient's sodium rapidly corrected, patient was given DDAVP, he is currently stable, has no signs or symptoms of neurological deficits. It is thought that excessive water intake along with alcohol played a big role in patient's hyponatremia. His urine osmole out he was significantly decreased at 30. From talking with patient's sister on 4/25, they do confirm a lot of daily fluid intake, although she denies Etoh use. -Patient is a completely unreliable historian but reports consuming a large amount of water and soda daily. He has a history of alcoholism although his sister claims he has not consumed alcohol since 10/2012.    Acute on chronic encephalopathy  -likely due to the severe hyponatremia. Given polydipsia, also concerned for underlying psychiatric illness. CT scan of the head was negative for acute abnormalities. Mental status has slowly improved, however he was evaluated by psychiatry and deemed not to have capacity. 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 Currently he is calm, quiet. He appears very anxious at times and has been asking the nursing staff for allowing water beyond his fluid restriction.Started on low-dose Klonopin - Please note, TSH was in normal limits. RPR and HIV were  negative. Urine drug screen was positive for benzodiazepines.   Tobacco abuse  -continue daily nicotine patches   Leukocytosis  - Uncertain etiology.  - wbc has normalized.  - blood cultures NTGD, afebrile  -Urine analysis negative on 4/20  - Please continue to monitor CBC while at skilled nursing facility    Mild thrombophlebitis of right hand  - Secondary to infiltration of IV site, looks stable with mild erythema, have encouraged him/cold compresses, have placed on doxycycline for 5 days. No signs of any worsening overnight, it looks stable without any fluctuation. He should be able to make a fist, no tenderness on passive extension and flexion of the wrist or fingers.   Disposition: Remain inpatient-awaiting SNF bed  DVT Prophylaxis: Prophylactic Lovenox   Code Status: Full code   Family Communication Sister on 4/25 at bedside  Procedures:  Nond  CONSULTS:  pulmonary/intensive care and psychiatry  MEDICATIONS: Scheduled Meds: . clonazePAM  0.5 mg Oral BID  . doxycycline  100 mg Oral Q12H  . enoxaparin (LOVENOX) injection  40 mg Subcutaneous Q24H  . folic acid  1 mg Oral Daily  . LORazepam  2 mg Intramuscular Once  . multivitamin with minerals  1 tablet Oral Daily  . nicotine  21 mg Transdermal Daily  . risperiDONE  0.5 mg Oral QHS  . thiamine  100 mg Oral Daily   Or  . thiamine  100 mg Intravenous Daily   Continuous Infusions:  PRN Meds:.  Antibiotics: Anti-infectives   Start     Dose/Rate Route Frequency Ordered Stop   05/23/13 0000  doxycycline (VIBRA-TABS) 100 MG tablet     100  mg Oral Every 12 hours 05/23/13 1021     05/22/13 2200  doxycycline (VIBRA-TABS) tablet 100 mg     100 mg Oral Every 12 hours 05/22/13 2009         PHYSICAL EXAM: Vital signs in last 24 hours: Filed Vitals:   05/23/13 1536 05/23/13 1615 05/23/13 2006 05/24/13 0449  BP: 106/68  115/70 138/81  Pulse: 84  97 75  Temp: 98.8 F (37.1 C)  98.3 F (36.8 C) 98.7 F (37.1 C)   TempSrc: Oral  Oral Oral  Resp: 18  18 18   Height:  6' (1.829 m)    Weight:  73.982 kg (163 lb 1.6 oz)    SpO2: 96%  97% 99%    Weight change:  Filed Weights   05/23/13 1615  Weight: 73.982 kg (163 lb 1.6 oz)   Body mass index is 22.12 kg/(m^2).   Gen Exam: Awake and alert with clear speech.  Neck: Supple, No JVD.   Chest: B/L Clear.   CVS: S1 S2 Regular, no murmurs.  Abdomen: soft, BS +, non tender, non distended.  Extremities: no edema, lower extremities warm to touch. Neurologic: Non Focal.   Skin: No Rash.   Wounds: N/A.    Intake/Output from previous day:  Intake/Output Summary (Last 24 hours) at 05/24/13 1024 Last data filed at 05/24/13 0430  Gross per 24 hour  Intake    910 ml  Output   1175 ml  Net   -265 ml     LAB RESULTS: CBC  Recent Labs Lab 05/19/13 0530 05/19/13 1117 05/20/13 0415 05/21/13 0141 05/22/13 0630  WBC 9.5 14.2* 9.0 13.2* 9.1  HGB 12.9* 12.8* 12.2* 11.5* 12.1*  HCT 34.9* 34.7* 33.0* 31.1* 33.6*  PLT 350 391 298 293 377  MCV 83.1 83.6 85.1 84.5 85.7  MCH 30.7 30.8 31.4 31.3 30.9  MCHC 37.0* 36.9* 37.0* 37.0* 36.0  RDW 12.7 12.6 13.2 12.9 13.7    Chemistries   Recent Labs Lab 05/18/13 1550 05/18/13 2140  05/19/13 0530  05/20/13 0415  05/21/13 0141 05/21/13 0800 05/21/13 1400 05/22/13 0630 05/23/13 0726  NA 113* 119*  < > 128*  < > 127*  < > 122* 127* 131* 130* 135*  K 4.3 4.2  < > 3.4*  < > 3.3*  < > 3.8 3.8 3.8 3.7 4.1  CL 73* 83*  < > 90*  < > 91*  < > 86* 90* 95* 93* 96  CO2 25 22  < > 23  < > 22  < > 22 25 24 22 23   GLUCOSE 99 93  < > 151*  < > 121*  < > 91 93 98 103* 94  BUN 3* 3*  < > 3*  < > 3*  < > <3* <3* 3* 4* 5*  CREATININE 0.54 0.53  < > 0.57  < > 0.58  < > 0.57 0.55 0.60 0.62 0.64  CALCIUM 9.2 8.6  < > 8.6  < > 8.5  < > 8.6 9.0 9.0 8.7 9.2  MG  --  1.5  --  1.9  --  2.0  --   --   --   --   --   --   < > = values in this interval not displayed.  CBG:  Recent Labs Lab 05/18/13 1406 05/18/13 1851    GLUCAP 103* 100*    GFR Estimated Creatinine Clearance: 102.8 ml/min (by C-G formula based on Cr of 0.64).  Coagulation  profile No results found for this basename: INR, PROTIME,  in the last 168 hours  Cardiac Enzymes  Recent Labs Lab 05/18/13 1411 05/19/13 0530  CKMB  --  11.6*  TROPONINI <0.30 <0.30    No components found with this basename: POCBNP,  No results found for this basename: DDIMER,  in the last 72 hours No results found for this basename: HGBA1C,  in the last 72 hours No results found for this basename: CHOL, HDL, LDLCALC, TRIG, CHOLHDL, LDLDIRECT,  in the last 72 hours No results found for this basename: TSH, T4TOTAL, FREET3, T3FREE, THYROIDAB,  in the last 72 hours No results found for this basename: VITAMINB12, FOLATE, FERRITIN, TIBC, IRON, RETICCTPCT,  in the last 72 hours No results found for this basename: LIPASE, AMYLASE,  in the last 72 hours  Urine Studies No results found for this basename: UACOL, UAPR, USPG, UPH, UTP, UGL, UKET, UBIL, UHGB, UNIT, UROB, ULEU, UEPI, UWBC, URBC, UBAC, CAST, CRYS, UCOM, BILUA,  in the last 72 hours  MICROBIOLOGY: Recent Results (from the past 240 hour(s))  CULTURE, BLOOD (ROUTINE X 2)     Status: None   Collection Time    05/18/13  5:01 PM      Result Value Ref Range Status   Specimen Description BLOOD ARM LEFT   Final   Special Requests BOTTLES DRAWN AEROBIC AND ANAEROBIC 10CC   Final   Culture  Setup Time     Final   Value: 05/18/2013 22:40     Performed at [facility]   Culture     Final   Value:        BLOOD CULTURE RECEIVED NO GROWTH TO DATE CULTURE WILL BE HELD FOR 5 DAYS BEFORE ISSUING A FINAL NEGATIVE REPORT     Performed at [facility]   Report Status PENDING   Incomplete  CULTURE, BLOOD (ROUTINE X 2)     Status: None   Collection Time    05/18/13  5:30 PM      Result Value Ref Range Status   Specimen Description BLOOD HAND LEFT   Final   Special Requests BOTTLES DRAWN AEROBIC ONLY  9CC   Final   Culture  Setup Time     Final   Value: 05/18/2013 22:40     Performed at [facility]   Culture     Final   Value:        BLOOD CULTURE RECEIVED NO GROWTH TO DATE CULTURE WILL BE HELD FOR 5 DAYS BEFORE ISSUING A FINAL NEGATIVE REPORT     Performed at [facility]   Report Status PENDING   Incomplete    RADIOLOGY STUDIES/RESULTS: Dg Chest 1 View  05/18/2013   CLINICAL DATA:  Altered mental status.  Unresponsive patient.  EXAM: CHEST - 1 VIEW  COMPARISON:  DG CHEST 2 VIEW dated 05/13/2013  FINDINGS: Inferior half of the chest is excluded from view. The patient was noncompliant with imaging.  The cardiopericardial silhouette and mediastinal contours appear within normal limits. No airspace disease or effusion. No pneumothorax identified. Monitoring leads project over the chest.  IMPRESSION: 1. No visible acute cardiopulmonary disease. 2. Lung bases are cut off due to patient non compliance with imaging.   Electronically Signed   By: Andreas Newport M.D.   On: 05/18/2013 15:15   Ct Head Wo Contrast  05/18/2013   CLINICAL DATA:  Fall, with pain and injury.  Neck pain.  Head pain.  EXAM: CT HEAD  WITHOUT CONTRAST  CT CERVICAL SPINE WITHOUT CONTRAST  TECHNIQUE: Multidetector CT imaging of the head and cervical spine was performed following the standard protocol without intravenous contrast. Multiplanar CT image reconstructions of the cervical spine were also generated.  COMPARISON:  None.  FINDINGS: CT HEAD FINDINGS  No evidence for acute infarction, hemorrhage, mass lesion, hydrocephalus, or extra-axial fluid. There is mild atrophy. Mild chronic microvascular ischemic change is noted. Mild carotid siphon vascular calcification is noted. Calvarium is intact. There is no acute sinus or mastoid disease.  CT CERVICAL SPINE FINDINGS  There is no visible cervical spine fracture, traumatic subluxation, prevertebral soft tissue swelling, or intraspinal hematoma. Advanced disc space  narrowing at C5-6 and C6-7 with mild straightening of the cervical lordosis. Multilevel facet arthropathy. Bilateral foraminal narrowing at C5-6 and C6-7. Lung apices show mild scarring. Airway midline. Carotid atherosclerosis.  IMPRESSION: Mild atrophy and small vessel disease. No acute intracranial findings.  Cervical spondylosis.  No fracture or traumatic subluxation.   Electronically Signed   By: Davonna BellingJohn  Curnes M.D.   On: 05/18/2013 16:36   Ct Cervical Spine Wo Contrast  05/18/2013   CLINICAL DATA:  Fall, with pain and injury.  Neck pain.  Head pain.  EXAM: CT HEAD WITHOUT CONTRAST  CT CERVICAL SPINE WITHOUT CONTRAST  TECHNIQUE: Multidetector CT imaging of the head and cervical spine was performed following the standard protocol without intravenous contrast. Multiplanar CT image reconstructions of the cervical spine were also generated.  COMPARISON:  None.  FINDINGS: CT HEAD FINDINGS  No evidence for acute infarction, hemorrhage, mass lesion, hydrocephalus, or extra-axial fluid. There is mild atrophy. Mild chronic microvascular ischemic change is noted. Mild carotid siphon vascular calcification is noted. Calvarium is intact. There is no acute sinus or mastoid disease.  CT CERVICAL SPINE FINDINGS  There is no visible cervical spine fracture, traumatic subluxation, prevertebral soft tissue swelling, or intraspinal hematoma. Advanced disc space narrowing at C5-6 and C6-7 with mild straightening of the cervical lordosis. Multilevel facet arthropathy. Bilateral foraminal narrowing at C5-6 and C6-7. Lung apices show mild scarring. Airway midline. Carotid atherosclerosis.  IMPRESSION: Mild atrophy and small vessel disease. No acute intracranial findings.  Cervical spondylosis.  No fracture or traumatic subluxation.   Electronically Signed   By: Davonna BellingJohn  Curnes M.D.   On: 05/18/2013 16:36   Dg Chest Port 1 View  05/19/2013   CLINICAL DATA:  Central line placement.  EXAM: PORTABLE CHEST - 1 VIEW  COMPARISON:  None.   FINDINGS: Left IJ catheter is in place with the tip projecting over the lower superior vena cava. There is no pneumothorax. Lungs are clear. Heart size is normal.  IMPRESSION: Left IJ catheter tip projects over the lower superior vena cava. Negative for pneumothorax.  Lungs clear.   Electronically Signed   By: Drusilla Kannerhomas  Dalessio M.D.   On: 05/19/2013 15:50    Shanker Levora DredgeM Ghimire, MD  [facility] Pager:[phone]  If 7PM-7AM, please contact night-coverage [email or web address] Password [email or web address] 05/24/2013, 10:24 AM   LOS: 5 days

## 2013-05-25 MED ORDER — DOXYCYCLINE HYCLATE 100 MG PO TABS: 100.0000 mg | ORAL_TABLET | Freq: Two times a day (BID) | ORAL | Status: DC

## 2013-05-25 NOTE — Progress Notes (Signed)
CSW (Clinical Social Worker) prepared pt dc packet and placed with shadow chart. CSW arranged non-emergent ambulance transport. Pt family, pt nurse, and facility informed. CSW signing off.  Tachina Spoonemore, LCSWA [phone]  

## 2013-05-25 NOTE — Progress Notes (Signed)
Nsg Discharge Note  Admit Date:  05/19/2013 Discharge date: 05/25/2013   Gerhard PerchesKeith Bramlett to be D/C'd Skilled nursing facility per MD order.  AVS completed.  Copy for chart, and copy for patient signed, and dated. Patient/caregiver able to verbalize understanding.  Discharge Medication:   Medication List         acetaminophen 500 MG tablet  Commonly known as:  TYLENOL  Take 500 mg by mouth every 6 (six) hours as needed.     clonazePAM 0.5 MG tablet  Commonly known as:  KLONOPIN  Take 1 tablet (0.5 mg total) by mouth 2 (two) times daily.     doxycycline 100 MG tablet  Commonly known as:  VIBRA-TABS  Take 1 tablet (100 mg total) by mouth every 12 (twelve) hours. Stop on 4/29.     folic acid 1 MG tablet  Commonly known as:  FOLVITE  Take 1 tablet (1 mg total) by mouth daily.     multivitamin with minerals Tabs tablet  Take 1 tablet by mouth daily.     nicotine 21 mg/24hr patch  Commonly known as:  NICODERM CQ - dosed in mg/24 hours  Place 1 patch (21 mg total) onto the skin daily.     risperiDONE 0.5 MG tablet  Commonly known as:  RISPERDAL  Take 1 tablet (0.5 mg total) by mouth at bedtime.     thiamine 100 MG tablet  Take 1 tablet (100 mg total) by mouth daily.        Discharge Assessment: Filed Vitals:   05/25/13 1402  BP: 135/91  Pulse: 70  Temp: 97.7 F (36.5 C)  Resp: 16   Skin clean, dry and intact without evidence of skin break down, no evidence of skin tears noted. IV catheter discontinued intact. Site without signs and symptoms of complications - no redness or edema noted at insertion site, patient denies c/o pain - only slight tenderness at site.  Dressing with slight pressure applied.  D/c Instructions-Education: Discharge instructions given to patient/family with verbalized understanding. D/c education completed with patient/family including follow up instructions, medication list, d/c activities limitations if indicated, with other d/c instructions as  indicated by MD - patient able to verbalize understanding, all questions fully answered. Patient instructed to return to ED, call 911, or call MD for any changes in condition.  Patient escorted via EMS. Report called into [facility].  Kern ReapKatie L Helen Cuff, RN 05/25/2013 4:42 PM
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
## 2013-05-25 NOTE — Progress Notes (Signed)
PATIENT DETAILS Name: [name] [name] Age: 58 y.o. Sex: male Date of Birth: August 04, 1955 Admit Date: 05/19/2013 Admitting Physician Kalman Shan, MD PCP:No PCP Per Patient  Subjective: No major complatins, awaiting SNF bed  Assessment/Plan:  Significant Hyponatremia -  -NA level 108 on admission likely due to polydipsia. Patient was admitted to the intensive care unit by the pulmonary critical care team, and was provided with supportive care and IV fluids. Sodium levels were closely followed, sodium levels have slowly improved and now have corrected by the day of discharge. At one point while in the intensive care unit, patient's sodium rapidly corrected, patient was given DDAVP, he is currently stable, has no signs or symptoms of neurological deficits. It is thought that excessive water intake along with alcohol played a big role in patient's hyponatremia. His urine osmolality was significantly decreased at 30. From talking with patient's sister on 4/25, they do confirm a lot of daily fluid intake, although she denies Etoh use.Na has now normalized, will continue to monitor periodically, continue with fluid restriction. -Patient is a completely unreliable historian but reports consuming a large amount of water and soda daily. He has a history of alcoholism although his sister claims he has not consumed alcohol since 10/2012.    Acute on chronic encephalopathy  -likely due to the severe hyponatremia. Given polydipsia, also concerned for underlying psychiatric illness. CT scan of the head was negative for acute abnormalities. Mental status has slowly improved, however he was evaluated by psychiatry and deemed not to have capacity. 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 Currently he is calm, quiet. He appears very anxious at times and has been asking the nursing staff for allowing water beyond his  fluid restriction.Started on low-dose Klonopin - Please note, TSH was in normal limits. RPR and HIV were negative. Urine drug screen was positive for benzodiazepines.   Tobacco abuse  -continue daily nicotine patches   Leukocytosis  - Uncertain etiology.  - wbc has normalized.  - blood cultures NTGD, afebrile  -Urine analysis negative on 4/20  - Please continue to monitor CBC while at skilled nursing facility    Mild thrombophlebitis of right hand  - Secondary to infiltration of IV site, looks stable with mild erythema, have encouraged him/cold compresses, have placed on doxycycline for 5 days (currently day 4). No signs of any worsening overnight, it looks stable without any fluctuation. He is able to make a fist, no tenderness on passive extension and flexion of the wrist or fingers.   Disposition: Remain inpatient-awaiting SNF bed  DVT Prophylaxis: Prophylactic Lovenox   Code Status: Full code   Family Communication Sister on 4/25 at bedside  Procedures:  None  CONSULTS:  pulmonary/intensive care and psychiatry  MEDICATIONS: Scheduled Meds: . clonazePAM  0.5 mg Oral BID  . doxycycline  100 mg Oral Q12H  . enoxaparin (LOVENOX) injection  40 mg Subcutaneous Q24H  . folic acid  1 mg Oral Daily  . LORazepam  2 mg Intramuscular Once  . multivitamin with minerals  1 tablet Oral Daily  . nicotine  21 mg Transdermal Daily  . risperiDONE  0.5 mg Oral QHS  . thiamine  100 mg Oral Daily   Continuous Infusions:  PRN Meds:.  Antibiotics: Anti-infectives   Start     Dose/Rate Route Frequency Ordered Stop   05/23/13 0000  doxycycline (VIBRA-TABS) 100 MG tablet  100 mg Oral Every 12 hours 05/23/13 1021     05/22/13 2200  doxycycline (VIBRA-TABS) tablet 100 mg     100 mg Oral Every 12 hours 05/22/13 2009         PHYSICAL EXAM: Vital signs in last 24 hours: Filed Vitals:   05/24/13 0449 05/24/13 1446 05/24/13 2139 05/25/13 0536  BP: 138/81 138/81 117/71 105/70    Pulse: 75 79 66 65  Temp: 98.7 F (37.1 C) 97.8 F (36.6 C) 98.4 F (36.9 C) 98.2 F (36.8 C)  TempSrc: Oral Oral Oral Oral  Resp: 18 18 18 18   Height:      Weight:      SpO2: 99% 100% 99% 98%    Weight change:  Filed Weights   05/23/13 1615  Weight: 73.982 kg (163 lb 1.6 oz)   Body mass index is 22.12 kg/(m^2).   Gen Exam: Awake and alert with clear speech.  Neck: Supple, No JVD.   Chest: B/L Clear.   CVS: S1 S2 Regular, no murmurs.  Abdomen: soft, BS +, non tender, non distended.  Extremities: no edema, lower extremities warm to touch.Mild erythema on the dorsum of right hand-with no fluctuation. No tenderness in passive range of motion of wrist and fingers. Neurologic: Non Focal.   Skin: No Rash.   Wounds: N/A.    Intake/Output from previous day:  Intake/Output Summary (Last 24 hours) at 05/25/13 1045 Last data filed at 05/25/13 0320  Gross per 24 hour  Intake    777 ml  Output      0 ml  Net    777 ml     LAB RESULTS: CBC  Recent Labs Lab 05/19/13 0530 05/19/13 1117 05/20/13 0415 05/21/13 0141 05/22/13 0630  WBC 9.5 14.2* 9.0 13.2* 9.1  HGB 12.9* 12.8* 12.2* 11.5* 12.1*  HCT 34.9* 34.7* 33.0* 31.1* 33.6*  PLT 350 391 298 293 377  MCV 83.1 83.6 85.1 84.5 85.7  MCH 30.7 30.8 31.4 31.3 30.9  MCHC 37.0* 36.9* 37.0* 37.0* 36.0  RDW 12.7 12.6 13.2 12.9 13.7    Chemistries   Recent Labs Lab 05/18/13 1550 05/18/13 2140  05/19/13 0530  05/20/13 0415  05/21/13 0141 05/21/13 0800 05/21/13 1400 05/22/13 0630 05/23/13 0726  NA 113* 119*  < > 128*  < > 127*  < > 122* 127* 131* 130* 135*  K 4.3 4.2  < > 3.4*  < > 3.3*  < > 3.8 3.8 3.8 3.7 4.1  CL 73* 83*  < > 90*  < > 91*  < > 86* 90* 95* 93* 96  CO2 25 22  < > 23  < > 22  < > 22 25 24 22 23   GLUCOSE 99 93  < > 151*  < > 121*  < > 91 93 98 103* 94  BUN 3* 3*  < > 3*  < > 3*  < > <3* <3* 3* 4* 5*  CREATININE 0.54 0.53  < > 0.57  < > 0.58  < > 0.57 0.55 0.60 0.62 0.64  CALCIUM 9.2 8.6  < > 8.6  <  > 8.5  < > 8.6 9.0 9.0 8.7 9.2  MG  --  1.5  --  1.9  --  2.0  --   --   --   --   --   --   < > = values in this interval not displayed.  CBG:  Recent Labs Lab 05/18/13 1406 05/18/13 1851  GLUCAP  103* 100*    GFR Estimated Creatinine Clearance: 102.8 ml/min (by C-G formula based on Cr of 0.64).  Coagulation profile No results found for this basename: INR, PROTIME,  in the last 168 hours  Cardiac Enzymes  Recent Labs Lab 05/18/13 1411 05/19/13 0530  CKMB  --  11.6*  TROPONINI <0.30 <0.30    No components found with this basename: POCBNP,  No results found for this basename: DDIMER,  in the last 72 hours No results found for this basename: HGBA1C,  in the last 72 hours No results found for this basename: CHOL, HDL, LDLCALC, TRIG, CHOLHDL, LDLDIRECT,  in the last 72 hours No results found for this basename: TSH, T4TOTAL, FREET3, T3FREE, THYROIDAB,  in the last 72 hours No results found for this basename: VITAMINB12, FOLATE, FERRITIN, TIBC, IRON, RETICCTPCT,  in the last 72 hours No results found for this basename: LIPASE, AMYLASE,  in the last 72 hours  Urine Studies No results found for this basename: UACOL, UAPR, USPG, UPH, UTP, UGL, UKET, UBIL, UHGB, UNIT, UROB, ULEU, UEPI, UWBC, URBC, UBAC, CAST, CRYS, UCOM, BILUA,  in the last 72 hours  MICROBIOLOGY: Recent Results (from the past 240 hour(s))  CULTURE, BLOOD (ROUTINE X 2)     Status: None   Collection Time    05/18/13  5:01 PM      Result Value Ref Range Status   Specimen Description BLOOD ARM LEFT   Final   Special Requests BOTTLES DRAWN AEROBIC AND ANAEROBIC 10CC   Final   Culture  Setup Time     Final   Value: 05/18/2013 22:40     Performed at [facility]   Culture     Final   Value:        BLOOD CULTURE RECEIVED NO GROWTH TO DATE CULTURE WILL BE HELD FOR 5 DAYS BEFORE ISSUING A FINAL NEGATIVE REPORT     Performed at [facility]   Report Status PENDING   Incomplete  CULTURE, BLOOD  (ROUTINE X 2)     Status: None   Collection Time    05/18/13  5:30 PM      Result Value Ref Range Status   Specimen Description BLOOD HAND LEFT   Final   Special Requests BOTTLES DRAWN AEROBIC ONLY 9CC   Final   Culture  Setup Time     Final   Value: 05/18/2013 22:40     Performed at [facility]   Culture     Final   Value:        BLOOD CULTURE RECEIVED NO GROWTH TO DATE CULTURE WILL BE HELD FOR 5 DAYS BEFORE ISSUING A FINAL NEGATIVE REPORT     Performed at [facility]   Report Status PENDING   Incomplete    RADIOLOGY STUDIES/RESULTS: Dg Chest 1 View  05/18/2013   CLINICAL DATA:  Altered mental status.  Unresponsive patient.  EXAM: CHEST - 1 VIEW  COMPARISON:  DG CHEST 2 VIEW dated 05/13/2013  FINDINGS: Inferior half of the chest is excluded from view. The patient was noncompliant with imaging.  The cardiopericardial silhouette and mediastinal contours appear within normal limits. No airspace disease or effusion. No pneumothorax identified. Monitoring leads project over the chest.  IMPRESSION: 1. No visible acute cardiopulmonary disease. 2. Lung bases are cut off due to patient non compliance with imaging.   Electronically Signed   By: Andreas NewportGeoffrey  Lamke M.D.   On: 05/18/2013 15:15   Ct Head Wo Contrast  05/18/2013   CLINICAL DATA:  Fall, with pain and injury.  Neck pain.  Head pain.  EXAM: CT HEAD WITHOUT CONTRAST  CT CERVICAL SPINE WITHOUT CONTRAST  TECHNIQUE: Multidetector CT imaging of the head and cervical spine was performed following the standard protocol without intravenous contrast. Multiplanar CT image reconstructions of the cervical spine were also generated.  COMPARISON:  None.  FINDINGS: CT HEAD FINDINGS  No evidence for acute infarction, hemorrhage, mass lesion, hydrocephalus, or extra-axial fluid. There is mild atrophy. Mild chronic microvascular ischemic change is noted. Mild carotid siphon vascular calcification is noted. Calvarium is intact. There is no acute sinus  or mastoid disease.  CT CERVICAL SPINE FINDINGS  There is no visible cervical spine fracture, traumatic subluxation, prevertebral soft tissue swelling, or intraspinal hematoma. Advanced disc space narrowing at C5-6 and C6-7 with mild straightening of the cervical lordosis. Multilevel facet arthropathy. Bilateral foraminal narrowing at C5-6 and C6-7. Lung apices show mild scarring. Airway midline. Carotid atherosclerosis.  IMPRESSION: Mild atrophy and small vessel disease. No acute intracranial findings.  Cervical spondylosis.  No fracture or traumatic subluxation.   Electronically Signed   By: Davonna Belling M.D.   On: 05/18/2013 16:36   Ct Cervical Spine Wo Contrast  05/18/2013   CLINICAL DATA:  Fall, with pain and injury.  Neck pain.  Head pain.  EXAM: CT HEAD WITHOUT CONTRAST  CT CERVICAL SPINE WITHOUT CONTRAST  TECHNIQUE: Multidetector CT imaging of the head and cervical spine was performed following the standard protocol without intravenous contrast. Multiplanar CT image reconstructions of the cervical spine were also generated.  COMPARISON:  None.  FINDINGS: CT HEAD FINDINGS  No evidence for acute infarction, hemorrhage, mass lesion, hydrocephalus, or extra-axial fluid. There is mild atrophy. Mild chronic microvascular ischemic change is noted. Mild carotid siphon vascular calcification is noted. Calvarium is intact. There is no acute sinus or mastoid disease.  CT CERVICAL SPINE FINDINGS  There is no visible cervical spine fracture, traumatic subluxation, prevertebral soft tissue swelling, or intraspinal hematoma. Advanced disc space narrowing at C5-6 and C6-7 with mild straightening of the cervical lordosis. Multilevel facet arthropathy. Bilateral foraminal narrowing at C5-6 and C6-7. Lung apices show mild scarring. Airway midline. Carotid atherosclerosis.  IMPRESSION: Mild atrophy and small vessel disease. No acute intracranial findings.  Cervical spondylosis.  No fracture or traumatic subluxation.    Electronically Signed   By: Davonna Belling M.D.   On: 05/18/2013 16:36   Dg Chest Port 1 View  05/19/2013   CLINICAL DATA:  Central line placement.  EXAM: PORTABLE CHEST - 1 VIEW  COMPARISON:  None.  FINDINGS: Left IJ catheter is in place with the tip projecting over the lower superior vena cava. There is no pneumothorax. Lungs are clear. Heart size is normal.  IMPRESSION: Left IJ catheter tip projects over the lower superior vena cava. Negative for pneumothorax.  Lungs clear.   Electronically Signed   By: Drusilla Kanner M.D.   On: 05/19/2013 15:50    Tammy Wickliffe Levora Dredge, MD  [facility] Pager:[phone]  If 7PM-7AM, please contact night-coverage [email or web address] Password [email or web address] 05/25/2013, 10:45 AM   LOS: 6 days

## 2013-05-26 NOTE — Progress Notes (Signed)
Restraints discontinued at 9:00AM. Sitter at bedside. Patient continues to be compliant and is resting comfortably in the room.

## 2013-05-26 NOTE — Progress Notes (Signed)
Patient restraints discontinued. Patient continues to be compliant and resting comfortably in room.

## 2013-06-03 LAB — HIV ANTIBODY (ROUTINE TESTING W REFLEX): HIV 1&2 Ab, 4th Generation: NONREACTIVE — AB

## 2013-06-03 LAB — CULTURE, BLOOD (ROUTINE X 2)

## 2015-12-11 ENCOUNTER — Emergency Department: Payer: Medicaid Other

## 2015-12-11 ENCOUNTER — Encounter: Payer: Self-pay | Admitting: Emergency Medicine

## 2015-12-11 ENCOUNTER — Emergency Department
Admission: EM | Admit: 2015-12-11 | Discharge: 2015-12-12 | Disposition: A | Discharge status: Home / Self Care | Payer: Medicaid Other | Attending: Student in an Organized Health Care Education/Training Program | Admitting: Student in an Organized Health Care Education/Training Program

## 2015-12-11 DIAGNOSIS — F29 Unspecified psychosis not due to a substance or known physiological condition: Secondary | ICD-10-CM | POA: Insufficient documentation

## 2015-12-11 DIAGNOSIS — F17210 Nicotine dependence, cigarettes, uncomplicated: Secondary | ICD-10-CM | POA: Diagnosis not present

## 2015-12-11 DIAGNOSIS — F0391 Unspecified dementia with behavioral disturbance: Secondary | ICD-10-CM | POA: Insufficient documentation

## 2015-12-11 DIAGNOSIS — Z79899 Other long term (current) drug therapy: Secondary | ICD-10-CM | POA: Insufficient documentation

## 2015-12-11 DIAGNOSIS — F919 Conduct disorder, unspecified: Secondary | ICD-10-CM | POA: Diagnosis present

## 2015-12-11 LAB — COMPREHENSIVE METABOLIC PANEL
ALBUMIN: 4.7 g/dL (ref 3.5–5.0)
ALT: 24 U/L (ref 17–63)
AST: 30 U/L (ref 15–41)
Alkaline Phosphatase: 50 U/L (ref 38–126)
Anion gap: 9 (ref 5–15)
BILIRUBIN TOTAL: 1.1 mg/dL (ref 0.3–1.2)
BUN: 8 mg/dL (ref 6–20)
CHLORIDE: 106 mmol/L (ref 101–111)
CO2: 24 mmol/L (ref 22–32)
Calcium: 9.8 mg/dL (ref 8.9–10.3)
Creatinine, Ser: 0.82 mg/dL (ref 0.61–1.24)
GFR calc Af Amer: >60 mL/min (ref >60)
GFR calc non Af Amer: >60 mL/min (ref >60)
Glucose, Bld: 126 mg/dL — ABNORMAL HIGH (ref 65–99)
POTASSIUM: 3.4 mmol/L — AB (ref 3.5–5.1)
Sodium: 139 mmol/L (ref 135–145)
Total Protein: 7.9 g/dL (ref 6.5–8.1)

## 2015-12-11 LAB — CBC
HEMATOCRIT: 46.5 % (ref 40.0–52.0)
Hemoglobin: 15.5 g/dL (ref 13.0–18.0)
MCH: 30 pg (ref 26.0–34.0)
MCHC: 33.3 g/dL (ref 32.0–36.0)
MCV: 89.9 fL (ref 80.0–100.0)
Platelets: 273 10*3/uL (ref 150–440)
RBC: 5.17 MIL/uL (ref 4.40–5.90)
RDW: 14 % (ref 11.5–14.5)
WBC: 10.7 10*3/uL — AB (ref 3.8–10.6)

## 2015-12-11 LAB — URINE DRUG SCREEN, QUALITATIVE (ARMC ONLY)
AMPHETAMINES, UR SCREEN: NOT DETECTED
Barbiturates, Ur Screen: NOT DETECTED
Benzodiazepine, Ur Scrn: NOT DETECTED
Cannabinoid 50 Ng, Ur ~~LOC~~: NOT DETECTED
Cocaine Metabolite,Ur ~~LOC~~: NOT DETECTED
MDMA (ECSTASY) UR SCREEN: NOT DETECTED
Methadone Scn, Ur: NOT DETECTED
OPIATE, UR SCREEN: NOT DETECTED
PHENCYCLIDINE (PCP) UR S: NOT DETECTED
Tricyclic, Ur Screen: NOT DETECTED

## 2015-12-11 LAB — ETHANOL: Alcohol, Ethyl (B): <5 mg/dL (ref <5)

## 2015-12-11 LAB — ACETAMINOPHEN LEVEL: Acetaminophen (Tylenol), Serum: <10 ug/mL — ABNORMAL LOW (ref 10–30)

## 2015-12-11 LAB — SALICYLATE LEVEL: Salicylate Lvl: <7 mg/dL (ref 2.8–30.0)

## 2015-12-11 MED ORDER — OLANZAPINE 5 MG PO TABS
5.0000 mg | ORAL_TABLET | Freq: Once | ORAL | Status: AC
  Administered 2015-12-11: 5 mg via ORAL
  Filled 2015-12-11: qty 1

## 2015-12-11 MED ORDER — OLANZAPINE 2.5 MG PO TABS: 2.5000 mg | ORAL_TABLET | Freq: Every day | ORAL | 2 refills | Status: AC

## 2015-12-11 MED ORDER — LORAZEPAM 1 MG PO TABS
1.0000 mg | ORAL_TABLET | Freq: Once | ORAL | Status: AC
  Administered 2015-12-11: 1 mg via ORAL
  Filled 2015-12-11: qty 1

## 2015-12-11 MED ORDER — HALOPERIDOL 5 MG PO TABS
5.0000 mg | ORAL_TABLET | Freq: Once | ORAL | Status: AC
  Administered 2015-12-11: 5 mg via ORAL
  Filled 2015-12-11: qty 1

## 2015-12-11 MED ORDER — ACETAMINOPHEN 500 MG PO TABS
1000.0000 mg | ORAL_TABLET | Freq: Once | ORAL | Status: AC
  Administered 2015-12-11: 1000 mg via ORAL
  Filled 2015-12-11: qty 2
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
## 2015-12-11 NOTE — ED Provider Notes (Signed)
Patient's commitment was overturned by the Tele-psychiatry's. Patient has been started on Zyprexa. Underlying diagnosis is likely dementia. He will be on Zyprexa nightly, is stable for outpatient follow-up   Emily FilbertJonathan E Jennye Runquist, MD 12/11/15 [phone]

## 2015-12-11 NOTE — ED Notes (Signed)
Pt's IVC papers were rescinded @ 1830, nursing home refuses to take pt back, pending placement for patient.

## 2015-12-11 NOTE — ED Notes (Signed)
Patient transported to CT by RN and [employer] officer without incident.
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
## 2015-12-11 NOTE — ED Notes (Signed)
Pt given cup of sprite.  

## 2015-12-11 NOTE — Discharge Instructions (Addendum)
Please continue all of his medicines. Please be careful to reorient him frequently. Please return him to the ER for any further problems.
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
## 2015-12-11 NOTE — ED Notes (Signed)
[name] [name], [facility], contacted for pt return, [name] [name] immediately upon hearing the pt's name stated "we ain't taking him back", this RN attempted to ascertain why.  [name] [name] explained she doesn't know. [facility] at a number that didn't work, upon call back [name] [name] stated a Interior and spatial designerdirector would call me back at ED.
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
## 2015-12-12 MED ORDER — OLANZAPINE 5 MG PO TABS
2.5000 mg | ORAL_TABLET | Freq: Every day | ORAL | Status: DC
Start: 2015-12-12 — End: 2015-12-12

## 2015-12-12 MED ORDER — ACETAMINOPHEN 325 MG PO TABS
650.0000 mg | ORAL_TABLET | ORAL | Status: AC
  Administered 2015-12-12: 650 mg via ORAL

## 2015-12-12 MED ORDER — ACETAMINOPHEN 325 MG PO TABS
ORAL_TABLET | ORAL | Status: AC
  Filled 2015-12-12: qty 2

## 2015-12-12 NOTE — ED Notes (Signed)
Called [facility] for transport [phone]

## 2015-12-12 NOTE — Progress Notes (Signed)
CSW received a call from [facility] [phone]. Harrold Donathathan and Arline AspCindy state that pt can return to the facility. They are requesting that a script be sent with the pt for his Zyprexa. Pt will be transported by [facility]. EDP, RN, and ED Secretary aware of above.  Jonathon JordanLynn B Celia Gibbons, MSW, Theresia MajorsLCSWA [phone]

## 2015-12-12 NOTE — ED Notes (Addendum)
Pt given blanket.

## 2015-12-12 NOTE — ED Provider Notes (Signed)
Patient's group home now says he'll take him back.    Arnaldo NatalPaul F Arafat Cocuzza, MD 12/12/15 1045

## 2015-12-12 NOTE — Progress Notes (Signed)
[facility] at [phone]. The receptionist answered and CSW stated that pt has been psychiatrically and medically cleared and is ready to be picked up from the ED. CSW inquired what time a staff member would be available to pick pt up. Receptionist states that CSW will have to speak to an Admissions Director Harrold Donath(Nathan or [place]) and they are both in a meeting right now. CSW left contact information with the receptionist and is expecting a call back soon from [place] or [facility].  Jonathon JordanLynn B Dakwan Pridgen, MSW, Theresia MajorsLCSWA [phone]

## 2015-12-12 NOTE — ED Notes (Signed)
TTS and charge notified of situation
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
## 2018-06-17 ENCOUNTER — Emergency Department
Admission: EM | Admit: 2018-06-17 | Discharge: 2018-06-17 | Disposition: A | Discharge status: Other Acute Inpatient Hospital | Payer: Medicare Other | Attending: Emergency Medicine | Admitting: Emergency Medicine

## 2018-06-17 ENCOUNTER — Other Ambulatory Visit: Payer: Self-pay

## 2018-06-17 ENCOUNTER — Emergency Department: Payer: Medicare Other

## 2018-06-17 ENCOUNTER — Inpatient Hospital Stay (HOSPITAL_COMMUNITY)
Admission: AD | Admit: 2018-06-17 | Discharge: 2018-06-30 | DRG: 177 | Disposition: A | Discharge status: Skilled Nursing Facility | Payer: Medicare Other | Source: Other Acute Inpatient Hospital | Attending: Family Medicine | Admitting: Family Medicine

## 2018-06-17 DIAGNOSIS — E785 Hyperlipidemia, unspecified: Secondary | ICD-10-CM | POA: Diagnosis present

## 2018-06-17 DIAGNOSIS — F1010 Alcohol abuse, uncomplicated: Secondary | ICD-10-CM | POA: Diagnosis present

## 2018-06-17 DIAGNOSIS — F17210 Nicotine dependence, cigarettes, uncomplicated: Secondary | ICD-10-CM | POA: Insufficient documentation

## 2018-06-17 DIAGNOSIS — J189 Pneumonia, unspecified organism: Secondary | ICD-10-CM | POA: Diagnosis present

## 2018-06-17 DIAGNOSIS — F329 Major depressive disorder, single episode, unspecified: Secondary | ICD-10-CM | POA: Diagnosis present

## 2018-06-17 DIAGNOSIS — F1290 Cannabis use, unspecified, uncomplicated: Secondary | ICD-10-CM | POA: Diagnosis present

## 2018-06-17 DIAGNOSIS — Z1159 Encounter for screening for other viral diseases: Secondary | ICD-10-CM

## 2018-06-17 DIAGNOSIS — G9341 Metabolic encephalopathy: Secondary | ICD-10-CM | POA: Diagnosis present

## 2018-06-17 DIAGNOSIS — F1690 Hallucinogen use, unspecified, uncomplicated: Secondary | ICD-10-CM | POA: Diagnosis present

## 2018-06-17 DIAGNOSIS — Z7289 Other problems related to lifestyle: Secondary | ICD-10-CM

## 2018-06-17 DIAGNOSIS — R0602 Shortness of breath: Secondary | ICD-10-CM

## 2018-06-17 DIAGNOSIS — Z781 Physical restraint status: Secondary | ICD-10-CM

## 2018-06-17 DIAGNOSIS — R0902 Hypoxemia: Secondary | ICD-10-CM | POA: Insufficient documentation

## 2018-06-17 DIAGNOSIS — F419 Anxiety disorder, unspecified: Secondary | ICD-10-CM | POA: Diagnosis present

## 2018-06-17 DIAGNOSIS — J9600 Acute respiratory failure, unspecified whether with hypoxia or hypercapnia: Secondary | ICD-10-CM

## 2018-06-17 DIAGNOSIS — U071 COVID-19: Principal | ICD-10-CM | POA: Diagnosis present

## 2018-06-17 DIAGNOSIS — J1289 Other viral pneumonia: Secondary | ICD-10-CM | POA: Diagnosis present

## 2018-06-17 DIAGNOSIS — R001 Bradycardia, unspecified: Secondary | ICD-10-CM | POA: Diagnosis not present

## 2018-06-17 DIAGNOSIS — Z6830 Body mass index (BMI) 30.0-30.9, adult: Secondary | ICD-10-CM | POA: Diagnosis not present

## 2018-06-17 DIAGNOSIS — F1490 Cocaine use, unspecified, uncomplicated: Secondary | ICD-10-CM | POA: Diagnosis present

## 2018-06-17 DIAGNOSIS — Z79899 Other long term (current) drug therapy: Secondary | ICD-10-CM

## 2018-06-17 DIAGNOSIS — E876 Hypokalemia: Secondary | ICD-10-CM | POA: Diagnosis present

## 2018-06-17 DIAGNOSIS — F22 Delusional disorders: Secondary | ICD-10-CM | POA: Diagnosis present

## 2018-06-17 DIAGNOSIS — J9601 Acute respiratory failure with hypoxia: Secondary | ICD-10-CM | POA: Diagnosis present

## 2018-06-17 DIAGNOSIS — E669 Obesity, unspecified: Secondary | ICD-10-CM | POA: Diagnosis present

## 2018-06-17 DIAGNOSIS — G9349 Other encephalopathy: Secondary | ICD-10-CM | POA: Diagnosis not present

## 2018-06-17 DIAGNOSIS — Z8619 Personal history of other infectious and parasitic diseases: Secondary | ICD-10-CM

## 2018-06-17 DIAGNOSIS — I10 Essential (primary) hypertension: Secondary | ICD-10-CM | POA: Diagnosis present

## 2018-06-17 DIAGNOSIS — R509 Fever, unspecified: Secondary | ICD-10-CM | POA: Diagnosis present

## 2018-06-17 DIAGNOSIS — J069 Acute upper respiratory infection, unspecified: Secondary | ICD-10-CM

## 2018-06-17 DIAGNOSIS — R4182 Altered mental status, unspecified: Secondary | ICD-10-CM | POA: Diagnosis present

## 2018-06-17 LAB — COMPREHENSIVE METABOLIC PANEL
ALT: 17 U/L (ref 0–44)
AST: 38 U/L (ref 15–41)
Albumin: 3.7 g/dL (ref 3.5–5.0)
Alkaline Phosphatase: 38 U/L (ref 38–126)
Anion gap: 11 (ref 5–15)
BUN: 20 mg/dL (ref 8–23)
CO2: 28 mmol/L (ref 22–32)
Calcium: 8.5 mg/dL — ABNORMAL LOW (ref 8.9–10.3)
Chloride: 102 mmol/L (ref 98–111)
Creatinine, Ser: 1.14 mg/dL (ref 0.61–1.24)
GFR calc Af Amer: >60 mL/min (ref >60)
GFR calc non Af Amer: >60 mL/min (ref >60)
Glucose, Bld: 133 mg/dL — ABNORMAL HIGH (ref 70–99)
Potassium: 3.4 mmol/L — ABNORMAL LOW (ref 3.5–5.1)
Sodium: 141 mmol/L (ref 135–145)
Total Bilirubin: 0.7 mg/dL (ref 0.3–1.2)
Total Protein: 7.6 g/dL (ref 6.5–8.1)

## 2018-06-17 LAB — CBC WITH DIFFERENTIAL/PLATELET
Abs Immature Granulocytes: 0.03 10*3/uL (ref 0.00–0.07)
Basophils Absolute: 0 10*3/uL (ref 0.0–0.1)
Basophils Relative: 0 %
Eosinophils Absolute: 0 10*3/uL (ref 0.0–0.5)
Eosinophils Relative: 0 %
HCT: 46.5 % (ref 39.0–52.0)
Hemoglobin: 15.6 g/dL (ref 13.0–17.0)
Immature Granulocytes: 0 %
Lymphocytes Relative: 19 %
Lymphs Abs: 1.5 10*3/uL (ref 0.7–4.0)
MCH: 29.2 pg (ref 26.0–34.0)
MCHC: 33.5 g/dL (ref 30.0–36.0)
MCV: 87.1 fL (ref 80.0–100.0)
Monocytes Absolute: 0.6 10*3/uL (ref 0.1–1.0)
Monocytes Relative: 8 %
Neutro Abs: 5.5 10*3/uL (ref 1.7–7.7)
Neutrophils Relative %: 73 %
Platelets: 187 10*3/uL (ref 150–400)
RBC: 5.34 MIL/uL (ref 4.22–5.81)
RDW: 13.8 % (ref 11.5–15.5)
WBC: 7.5 10*3/uL (ref 4.0–10.5)
nRBC: 0 % (ref 0.0–0.2)

## 2018-06-17 LAB — FIBRIN DERIVATIVES D-DIMER (ARMC ONLY): Fibrin derivatives D-dimer (ARMC): 912.15 ng/mL (FEU) — ABNORMAL HIGH (ref 0.00–499.00)

## 2018-06-17 LAB — CBC
HCT: 42.1 % (ref 39.0–52.0)
Hemoglobin: 14 g/dL (ref 13.0–17.0)
MCH: 29.4 pg (ref 26.0–34.0)
MCHC: 33.3 g/dL (ref 30.0–36.0)
MCV: 88.3 fL (ref 80.0–100.0)
Platelets: 192 10*3/uL (ref 150–400)
RBC: 4.77 MIL/uL (ref 4.22–5.81)
RDW: 13.9 % (ref 11.5–15.5)
WBC: 9.5 10*3/uL (ref 4.0–10.5)
nRBC: 0 % (ref 0.0–0.2)

## 2018-06-17 LAB — D-DIMER, QUANTITATIVE: D-Dimer, Quant: 1.1 ug/mL-FEU — ABNORMAL HIGH (ref 0.00–0.50)

## 2018-06-17 LAB — FERRITIN: Ferritin: 396 ng/mL — ABNORMAL HIGH (ref 24–336)

## 2018-06-17 LAB — LACTIC ACID, PLASMA
Lactic Acid, Venous: 1.3 mmol/L (ref 0.5–1.9)
Lactic Acid, Venous: 0.8 mmol/L (ref 0.5–1.9)

## 2018-06-17 LAB — CREATININE, SERUM
Creatinine, Ser: 1.04 mg/dL (ref 0.61–1.24)
GFR calc Af Amer: >60 mL/min (ref >60)
GFR calc non Af Amer: >60 mL/min (ref >60)

## 2018-06-17 LAB — C-REACTIVE PROTEIN: CRP: 4.9 mg/dL — ABNORMAL HIGH (ref <1.0)

## 2018-06-17 LAB — ABO/RH: ABO/RH(D): O POS

## 2018-06-17 LAB — CK: Total CK: 69 U/L (ref 49–397)

## 2018-06-17 MED ORDER — ZINC SULFATE 220 (50 ZN) MG PO CAPS
220.0000 mg | ORAL_CAPSULE | Freq: Every day | ORAL | Status: DC
  Administered 2018-06-17 – 2018-06-30 (×14): 220 mg via ORAL
  Filled 2018-06-17 (×14): qty 1

## 2018-06-17 MED ORDER — SODIUM CHLORIDE 0.9 % IV SOLN
250.0000 mL | INTRAVENOUS | Status: DC | PRN
  Administered 2018-06-19 – 2018-06-22 (×3): 250 mL via INTRAVENOUS

## 2018-06-17 MED ORDER — GUAIFENESIN-DM 100-10 MG/5ML PO SYRP
10.0000 mL | ORAL_SOLUTION | ORAL | Status: DC | PRN
  Administered 2018-06-24: 10 mL via ORAL
  Filled 2018-06-17: qty 10

## 2018-06-17 MED ORDER — SODIUM CHLORIDE 0.9 % IV SOLN
Freq: Once | INTRAVENOUS | Status: AC
  Administered 2018-06-17: 14:00:00 via INTRAVENOUS

## 2018-06-17 MED ORDER — ACETAMINOPHEN 325 MG PO TABS
650.0000 mg | ORAL_TABLET | Freq: Four times a day (QID) | ORAL | Status: DC | PRN
  Administered 2018-06-19 – 2018-06-30 (×11): 650 mg via ORAL
  Filled 2018-06-17 (×11): qty 2

## 2018-06-17 MED ORDER — HYDROCOD POLST-CPM POLST ER 10-8 MG/5ML PO SUER
5.0000 mL | Freq: Two times a day (BID) | ORAL | Status: DC | PRN
  Filled 2018-06-17: qty 5

## 2018-06-17 MED ORDER — SODIUM CHLORIDE 0.9% FLUSH
3.0000 mL | Freq: Two times a day (BID) | INTRAVENOUS | Status: DC
  Administered 2018-06-17 – 2018-06-30 (×24): 3 mL via INTRAVENOUS

## 2018-06-17 MED ORDER — ASPIRIN EC 81 MG PO TBEC
81.0000 mg | DELAYED_RELEASE_TABLET | Freq: Every day | ORAL | Status: DC
  Administered 2018-06-17 – 2018-06-30 (×14): 81 mg via ORAL
  Filled 2018-06-17 (×14): qty 1

## 2018-06-17 MED ORDER — ACETAMINOPHEN 500 MG PO TABS
1000.0000 mg | ORAL_TABLET | Freq: Once | ORAL | Status: AC
  Administered 2018-06-17: 19:00:00 1000 mg via ORAL
  Filled 2018-06-17: qty 2

## 2018-06-17 MED ORDER — SODIUM CHLORIDE 0.9% FLUSH: 3.0000 mL | INTRAVENOUS | Status: DC | PRN

## 2018-06-17 MED ORDER — VITAMIN C 500 MG PO TABS
500.0000 mg | ORAL_TABLET | Freq: Every day | ORAL | Status: DC
  Administered 2018-06-17 – 2018-06-30 (×14): 500 mg via ORAL
  Filled 2018-06-17 (×14): qty 1

## 2018-06-17 MED ORDER — ONDANSETRON HCL 4 MG PO TABS: 4.0000 mg | ORAL_TABLET | Freq: Four times a day (QID) | ORAL | Status: DC | PRN

## 2018-06-17 MED ORDER — ENOXAPARIN SODIUM 40 MG/0.4ML ~~LOC~~ SOLN
40.0000 mg | SUBCUTANEOUS | Status: DC
  Administered 2018-06-17 – 2018-06-18 (×2): 40 mg via SUBCUTANEOUS
  Filled 2018-06-17 (×2): qty 0.4

## 2018-06-17 MED ORDER — ONDANSETRON HCL 4 MG/2ML IJ SOLN: 4.0000 mg | Freq: Four times a day (QID) | INTRAMUSCULAR | Status: DC | PRN

## 2018-06-17 MED ORDER — TOCILIZUMAB 400 MG/20ML IV SOLN
600.0000 mg | Freq: Once | INTRAVENOUS | Status: AC
  Administered 2018-06-17: 600 mg via INTRAVENOUS
  Filled 2018-06-17: qty 10

## 2018-06-17 NOTE — H&P (Signed)
History and Physical    [name] [name] WUJ:[id number] DOB: 1955/06/12 DOA: 06/17/2018  PCP: Patient, No Pcp Per  Patient coming from: Nursing home  Chief Complaint: Found to be confused and hypoxic tested positive for COVID-19 2 days ago  HPI: [name] [name] is a 63 y.o. male with medical history significant of alcohol abuse, encephalopathy was tested positive for COVID-19 at his skilled nursing facility approximately 2 days ago was found to be more short of breath hypoxic and confused today so therefore sent to [facility].  Patient is currently on 10 L of oxygen.  He is pleasantly confused he is alert and awake and in no respiratory distress.  Imaging shows bilateral infiltrates with some focality.  He does not have any lower extremity edema or swelling.  Patient cannot provide any answers due to his confusion and is referred for admission for acute hypoxic respiratory failure secondary to COVID-19 infection.  Review of Systems: Unobtainable secondary to patient's mental status changes  Past Medical History:  Diagnosis Date   Altered mental status    admission 05/19/2013   Anxiety    Depression    H/O [facility] spotted fever    Jaundice    "yellow jaundice; hospitalized"    Past Surgical History:  Procedure Laterality Date   TONSILLECTOMY       reports that he has been smoking cigarettes. He has a 15.00 pack-year smoking history. He has never used smokeless tobacco. He reports current alcohol use. He reports current drug use. Drugs: LSD, Cocaine, Other-see comments, and Marijuana.  No Known Allergies  No family history on file.  Unobtainable  Prior to Admission medications   Medication Sig Start Date End Date Taking? Authorizing Provider  acetaminophen (TYLENOL) 500 MG tablet Take 500 mg by mouth every 6 (six) hours as needed.    [provider]  amLODipine (NORVASC) 5 MG tablet Take 5 mg by mouth daily.    [provider]    atorvastatin (LIPITOR) 10 MG tablet Take 10 mg by mouth at bedtime.    [provider]  clonazePAM (KLONOPIN) 0.5 MG tablet Take 1 tablet (0.5 mg total) by mouth 2 (two) times daily. 05/23/13   Ghimire, Werner Lean, MD  Multiple Vitamin (MULTIVITAMIN WITH MINERALS) TABS tablet Take 1 tablet by mouth daily. 05/22/13   Dellinger, Tora Kindred, PA-C  OLANZapine (ZYPREXA) 2.5 MG tablet Take 1 tablet (2.5 mg total) by mouth at bedtime. 12/11/15 12/10/16  Emily Filbert, MD  thiamine 100 MG tablet Take 1 tablet (100 mg total) by mouth daily. 05/22/13   Dellinger, Tora Kindred, PA-C    Physical Exam: There were no vitals filed for this visit.    Constitutional: NAD, calm, comfortable There were no vitals filed for this visit. Eyes: PERRL, lids and conjunctivae normal ENMT: Mucous membranes are moist. Posterior pharynx clear of any exudate or lesions.Normal dentition.  Neck: normal, supple, no masses, no thyromegaly Respiratory: clear to auscultation bilaterally, no wheezing, no crackles. Normal respiratory effort. No accessory muscle use.  Cardiovascular: Regular rate and rhythm, no murmurs / rubs / gallops. No extremity edema. 2+ pedal pulses. No carotid bruits.  Abdomen: no tenderness, no masses palpated. No hepatosplenomegaly. Bowel sounds positive.  Musculoskeletal: no clubbing / cyanosis. No joint deformity upper and lower extremities. Good ROM, no contractures. Normal muscle tone.  Skin: no rashes, lesions, ulcers. No induration Neurologic: CN 2-12 grossly intact. Sensation intact, DTR normal. Strength 5/5 in all 4.  Psychiatric: . Alert  and oriented x 1 to name only. Normal mood.    Labs on Admission: I have personally reviewed following labs and imaging studies  CBC: Recent Labs  Lab 06/17/18 1327  WBC 7.5  NEUTROABS 5.5  HGB 15.6  HCT 46.5  MCV 87.1  PLT 187   Basic Metabolic Panel: Recent Labs  Lab 06/17/18 1327  NA 141  K 3.4*  CL 102  CO2 28  GLUCOSE 133*   BUN 20  CREATININE 1.14  CALCIUM 8.5*   GFR: Estimated Creatinine Clearance: 64.8 mL/min (by C-G formula based on SCr of 1.14 mg/dL). Liver Function Tests: Recent Labs  Lab 06/17/18 1327  AST 38  ALT 17  ALKPHOS 38  BILITOT 0.7  PROT 7.6  ALBUMIN 3.7   No results for input(s): LIPASE, AMYLASE in the last 168 hours. No results for input(s): AMMONIA in the last 168 hours. Coagulation Profile: No results for input(s): INR, PROTIME in the last 168 hours. Cardiac Enzymes: Recent Labs  Lab 06/17/18 1512  CKTOTAL 69   BNP (last 3 results) No results for input(s): PROBNP in the last 8760 hours. HbA1C: No results for input(s): HGBA1C in the last 72 hours. CBG: No results for input(s): GLUCAP in the last 168 hours. Lipid Profile: No results for input(s): CHOL, HDL, LDLCALC, TRIG, CHOLHDL, LDLDIRECT in the last 72 hours. Thyroid Function Tests: No results for input(s): TSH, T4TOTAL, FREET4, T3FREE, THYROIDAB in the last 72 hours. Anemia Panel: Recent Labs    06/17/18 1512  FERRITIN 396*   Urine analysis:    Component Value Date/Time   COLORURINE YELLOW 05/18/2013 1909   APPEARANCEUR CLEAR 05/18/2013 1909   LABSPEC 1.004 (L) 05/18/2013 1909   PHURINE 7.0 05/18/2013 1909   GLUCOSEU NEGATIVE 05/18/2013 1909   HGBUR NEGATIVE 05/18/2013 1909   BILIRUBINUR NEGATIVE 05/18/2013 1909   KETONESUR NEGATIVE 05/18/2013 1909   PROTEINUR NEGATIVE 05/18/2013 1909   UROBILINOGEN 0.2 05/18/2013 1909   NITRITE NEGATIVE 05/18/2013 1909   LEUKOCYTESUR NEGATIVE 05/18/2013 1909   Sepsis Labs: !!!!!!!!!!!!!!!!!!!!!!!!!!!!!!!!!!!!!!!!!!!! @LABRCNTIP (procalcitonin:4,lacticidven:4) )No results found for this or any previous visit (from the past 240 hour(s)).   Radiological Exams on Admission: Dg Chest Port 1 View  Result Date: 06/17/2018 CLINICAL DATA:  Fever EXAM: PORTABLE CHEST 1 VIEW COMPARISON:  May 19, 2013 FINDINGS: There are foci of airspace consolidation in each upper and  lower lobe region. There is a small left pleural effusion. Heart size and pulmonary vascularity are normal. No adenopathy. No bone lesions. IMPRESSION: Multifocal pneumonia. Small left pleural effusion. No adenopathy evident. Electronically Signed   By: Bretta BangWilliam  Woodruff III M.D.   On: 06/17/2018 14:13    Old chart reviewed Case discussed with Dr. Thedore MinsSingh with [facility] Chest x-ray reviewed with bilateral multifocal pneumonia Labs reviewed from outside facility  Assessment/Plan 63 year old male with acute hypoxic respiratory failure secondary COVID-19 infection and bilateral pneumonia with encephalopathy Principal Problem:   Acute respiratory failure with hypoxia (HCC)-provide supplemental oxygen as needed and wean as tolerates.  Treat COVID as below.  Currently no respiratory distress.  Provide supportive measures.  Active Problems:   COVID-19 virus infection-check procalcitonin level to rule out any evidence of bacterial infection.  Lactic acid level is normal.  I Actemra pharmacy to dose.  Patient has high clinical probability of deterioration requiring ICU level of care.  Monitor closely in progressive unit.  Minimize IV fluids.    Bilateral pneumonia-lactic acid level normal.  White count normal.  Checking procalcitonin level.  Pneumonia likely  secondary to viral infection COVID-19.    Altered mental status-secondary to infection no focal neurological deficits    Alcohol abuse-assuming sober the nursing home    Encephalopathy due to COVID-19 virus-as above   Clarify CODE STATUS with nursing home Med rec pending pharmacy review  DVT prophylaxis: Lovenox Code Status: Full Family Communication: None Disposition Plan: Days Consults called: None Admission status: Admission   Janeva Peaster A MD [facility]  If 7PM-7AM, please contact night-coverage [email or web address] Password [facility]  06/17/2018, 9:46 PM

## 2018-06-17 NOTE — ED Notes (Signed)
Report given to Tresa Endo, Charity fundraiser at [facility]. Pt assigned to room 174.

## 2018-06-17 NOTE — ED Provider Notes (Addendum)
[facility] Emergency Department Provider Note       Time seen: ----------------------------------------- 1:32 PM on 06/17/2018 ----------------------------------------- Level V caveat: History/ROS limited by memory disturbance  I have reviewed the triage vital signs and the nursing notes.  HISTORY   Chief Complaint Fever    HPI [name] [name] is a 63 y.o. male with a history of altered mental status, anxiety, depression, [place] spotted fever, alcohol abuse who presents to the ED for fever and hypoxia.  Patient is a resident of [facility] which is the location of the coronavirus outbreak.  Nursing facility states he tested positive as an outpatient yesterday and had a temperature of 103.5.  He was noted to be hypoxic this morning and does not normally wear oxygen.  He is alert but not oriented to time or place.  Past Medical History:  Diagnosis Date  . Altered mental status    admission 05/19/2013  . Anxiety   . Depression   . H/O [facility] spotted fever   . Jaundice    "yellow jaundice; hospitalized"    Patient Active Problem List   Diagnosis Date Noted  . Altered mental status 05/21/2013  . Alcohol abuse 05/21/2013  . Tobacco abuse 05/21/2013  . Hyponatremia 05/20/2013    Past Surgical History:  Procedure Laterality Date  . TONSILLECTOMY      Allergies Patient has no known allergies.  Social History Social History   Tobacco Use  . Smoking status: Current Every Day Smoker    Packs/day: 1.00    Years: 15.00    Pack years: 15.00    Types: Cigarettes  . Smokeless tobacco: Never Used  Substance Use Topics  . Alcohol use: Yes    Comment: 05/22/2013 "drink beer only when I'm not driving; when I do drink I'll drink 6-12 beers at one time"  . Drug use: Yes    Types: LSD, Cocaine, Other-see comments, Marijuana    Comment: "& MDA when I was 74-20 yr old"   Review of Systems Constitutional: Positive for fever Cardiovascular:  Negative for chest pain. Respiratory: Positive for shortness of breath Gastrointestinal: Negative for abdominal pain, vomiting and diarrhea. Musculoskeletal: Negative for back pain. Skin: Negative for rash. Neurological: Negative for headaches, focal weakness or numbness.  All systems negative/normal/unremarkable except as stated in the HPI  ____________________________________________   PHYSICAL EXAM:  VITAL SIGNS: ED Triage Vitals  Enc Vitals Group     BP --      Pulse --      Resp --      Temp 06/17/18 1311 (!) 100.7 F (38.2 C)     Temp Source 06/17/18 1311 Oral     SpO2 --      Weight 06/17/18 1305 180 lb (81.6 kg)     Height 06/17/18 1305 5\' 6"  (1.676 m)     Head Circumference --      Peak Flow --      Pain Score 06/17/18 1305 0     Pain Loc --      Pain Edu? --      Excl. in GC? --    Constitutional: Alert disoriented, mild distress Eyes: Conjunctivae are normal. Normal extraocular movements. ENT      Head: Normocephalic and atraumatic.      Nose: No congestion/rhinnorhea.      Mouth/Throat: Mucous membranes are moist.      Neck: No stridor. Cardiovascular: Normal rate, regular rhythm. No murmurs, rubs, or gallops. Respiratory: Normal respiratory  effort without tachypnea nor retractions. Breath sounds are clear and equal bilaterally. No wheezes/rales/rhonchi. Gastrointestinal: Soft and nontender. Normal bowel sounds Musculoskeletal: Nontender with normal range of motion in extremities. No lower extremity tenderness nor edema. Neurologic:  Normal speech and language. No gross focal neurologic deficits are appreciated.  Skin:  Skin is warm, diaphoresis is noted Psychiatric: Mood and affect are normal. Speech and behavior are normal.  ____________________________________________  EKG: Interpreted by me.  Sinus rhythm with rate of 72 bpm, low voltage QRS, normal axis, normal QT  ____________________________________________  ED COURSE:  As part of my medical  decision making, I reviewed the following data within the electronic MEDICAL RECORD NUMBER History obtained from family if available, nursing notes, old chart and ekg, as well as notes from prior ED visits. Patient presented for fever and hypoxia, we will assess with labs and imaging as indicated at this time.   Procedures  [name] [name] was evaluated in Emergency Department on 06/17/2018 for the symptoms described in the history of present illness. He was evaluated in the context of the global COVID-19 pandemic, which necessitated consideration that the patient might be at risk for infection with the SARS-CoV-2 virus that causes COVID-19. Institutional protocols and algorithms that pertain to the evaluation of patients at risk for COVID-19 are in a state of rapid change based on information released by regulatory bodies including the CDC and federal and state organizations. These policies and algorithms were followed during the patient's care in the ED.  ____________________________________________   LABS (pertinent positives/negatives)  Labs Reviewed  COMPREHENSIVE METABOLIC PANEL - Abnormal; Notable for the following components:      Result Value   Potassium 3.4 (*)    Glucose, Bld 133 (*)    Calcium 8.5 (*)    All other components within normal limits  CULTURE, BLOOD (ROUTINE X 2)  CULTURE, BLOOD (ROUTINE X 2)  URINE CULTURE  LACTIC ACID, PLASMA  CBC WITH DIFFERENTIAL/PLATELET  BLOOD GAS, VENOUS  LACTIC ACID, PLASMA  C-REACTIVE PROTEIN  FIBRIN DERIVATIVES D-DIMER ([facility] ONLY)  FERRITIN  CK   CRITICAL CARE Performed by: Ulice DashJohnathan E Deondra Wigger   Total critical care time: 30 minutes  Critical care time was exclusive of separately billable procedures and treating other patients.  Critical care was necessary to treat or prevent imminent or life-threatening deterioration.  Critical care was time spent personally by me on the following activities: development of treatment plan with patient  and/or surrogate as well as nursing, discussions with consultants, evaluation of patient's response to treatment, examination of patient, obtaining history from patient or surrogate, ordering and performing treatments and interventions, ordering and review of laboratory studies, ordering and review of radiographic studies, pulse oximetry and re-evaluation of patient's condition.  RADIOLOGY Images were viewed by me  Chest x-ray IMPRESSION: Multifocal pneumonia. Small left pleural effusion. No adenopathy evident. ____________________________________________   DIFFERENTIAL DIAGNOSIS   COVID-19, pneumonia, PE, pneumothorax, sepsis  FINAL ASSESSMENT AND PLAN  COVID-19, hypoxia, multifocal pneumonia  Plan: The patient had presented for fever and hypoxia. Patient's labs revealed normal CBC and CMP, initial lactic acid level has been normal. Patient's imaging did reveal a multifocal pneumonia.  Patient with known positive outpatient testing for COVID-19.  Symptomatology and lab work with chest x-ray imaging is consistent with same.  He has been given IV fluids here, is receiving 4 L of nasal cannula oxygen.  Patient will not receive IV antibiotics as this is believed to be viral.  I discussed with the transferring  doctor at [facility] who agrees with same.  We will plan for transport to [facility].   Ulice Dash, MD    Note: This note was generated in part or whole with voice recognition software. Voice recognition is usually quite accurate but there are transcription errors that can and very often do occur. I apologize for any typographical errors that were not detected and corrected.     Emily Filbert, MD 06/17/18 1427    Emily Filbert, MD 06/17/18 [phone]

## 2018-06-17 NOTE — ED Triage Notes (Signed)
Pt [facility] EMS from [facility]. Per nursing facility patient tested positive for covid yesterday and had a temp of 103.5 with hypoxia this morning. Pt is alert but not oriented to time or place. Pt able to ambulate to hospital stretcher from EMS stretcher.

## 2018-06-17 NOTE — ED Notes (Signed)
[facility]  CALLED  FOR  TRANSFER 

## 2018-06-18 ENCOUNTER — Encounter (HOSPITAL_COMMUNITY): Payer: Self-pay

## 2018-06-18 LAB — COMPREHENSIVE METABOLIC PANEL
ALT: 17 U/L (ref 0–44)
AST: 42 U/L — ABNORMAL HIGH (ref 15–41)
Albumin: 3.1 g/dL — ABNORMAL LOW (ref 3.5–5.0)
Alkaline Phosphatase: 32 U/L — ABNORMAL LOW (ref 38–126)
Anion gap: 13 (ref 5–15)
BUN: 22 mg/dL (ref 8–23)
CO2: 20 mmol/L — ABNORMAL LOW (ref 22–32)
Calcium: 8.1 mg/dL — ABNORMAL LOW (ref 8.9–10.3)
Chloride: 105 mmol/L (ref 98–111)
Creatinine, Ser: 1.12 mg/dL (ref 0.61–1.24)
GFR calc Af Amer: >60 mL/min (ref >60)
GFR calc non Af Amer: >60 mL/min (ref >60)
Glucose, Bld: 120 mg/dL — ABNORMAL HIGH (ref 70–99)
Potassium: 4 mmol/L (ref 3.5–5.1)
Sodium: 138 mmol/L (ref 135–145)
Total Bilirubin: 1.2 mg/dL (ref 0.3–1.2)
Total Protein: 6.8 g/dL (ref 6.5–8.1)

## 2018-06-18 LAB — BLOOD GAS, VENOUS
Acid-Base Excess: 1.5 mmol/L (ref 0.0–2.0)
Bicarbonate: 27.2 mmol/L (ref 20.0–28.0)
O2 Saturation: 48.8 %
Patient temperature: 37
pCO2, Ven: 46 mmHg (ref 44.0–60.0)
pH, Ven: 7.38 (ref 7.250–7.430)
pO2, Ven: 27 mmHg — CL (ref 32.0–45.0)

## 2018-06-18 LAB — CBC WITH DIFFERENTIAL/PLATELET
Abs Immature Granulocytes: 0.06 10*3/uL (ref 0.00–0.07)
Basophils Absolute: 0 10*3/uL (ref 0.0–0.1)
Basophils Relative: 0 %
Eosinophils Absolute: 0 10*3/uL (ref 0.0–0.5)
Eosinophils Relative: 0 %
HCT: 43.7 % (ref 39.0–52.0)
Hemoglobin: 14.6 g/dL (ref 13.0–17.0)
Immature Granulocytes: 1 %
Lymphocytes Relative: 12 %
Lymphs Abs: 1 10*3/uL (ref 0.7–4.0)
MCH: 29.4 pg (ref 26.0–34.0)
MCHC: 33.4 g/dL (ref 30.0–36.0)
MCV: 87.9 fL (ref 80.0–100.0)
Monocytes Absolute: 0.4 10*3/uL (ref 0.1–1.0)
Monocytes Relative: 5 %
Neutro Abs: 6.7 10*3/uL (ref 1.7–7.7)
Neutrophils Relative %: 82 %
Platelets: 193 10*3/uL (ref 150–400)
RBC: 4.97 MIL/uL (ref 4.22–5.81)
RDW: 13.9 % (ref 11.5–15.5)
WBC: 8.1 10*3/uL (ref 4.0–10.5)
nRBC: 0 % (ref 0.0–0.2)

## 2018-06-18 LAB — PROCALCITONIN: Procalcitonin: <0.1 ng/mL

## 2018-06-18 LAB — C-REACTIVE PROTEIN: CRP: 15 mg/dL — ABNORMAL HIGH (ref <1.0)

## 2018-06-18 LAB — GLUCOSE, CAPILLARY: Glucose-Capillary: 111 mg/dL — ABNORMAL HIGH (ref 70–99)

## 2018-06-18 LAB — FERRITIN: Ferritin: 818 ng/mL — ABNORMAL HIGH (ref 24–336)

## 2018-06-18 MED ORDER — AMLODIPINE BESYLATE 5 MG PO TABS: 5.0000 mg | ORAL_TABLET | Freq: Every day | ORAL | Status: DC

## 2018-06-18 MED ORDER — OLANZAPINE 2.5 MG PO TABS: 2.5000 mg | ORAL_TABLET | Freq: Every day | ORAL | Status: DC

## 2018-06-18 MED ORDER — CLONAZEPAM 0.5 MG PO TABS
0.5000 mg | ORAL_TABLET | Freq: Two times a day (BID) | ORAL | Status: DC
  Administered 2018-06-18 – 2018-06-22 (×8): 0.5 mg via ORAL
  Filled 2018-06-18 (×8): qty 1

## 2018-06-18 MED ORDER — ATORVASTATIN CALCIUM 10 MG PO TABS
10.0000 mg | ORAL_TABLET | Freq: Every day | ORAL | Status: DC
  Administered 2018-06-18 – 2018-06-19 (×2): 10 mg via ORAL
  Filled 2018-06-18 (×2): qty 1

## 2018-06-18 MED ORDER — METHYLPREDNISOLONE SODIUM SUCC 125 MG IJ SOLR
80.0000 mg | Freq: Two times a day (BID) | INTRAMUSCULAR | Status: DC
  Administered 2018-06-18 – 2018-06-19 (×3): 80 mg via INTRAVENOUS
  Filled 2018-06-18 (×3): qty 2

## 2018-06-18 MED ORDER — FOLIC ACID 1 MG PO TABS
1.0000 mg | ORAL_TABLET | Freq: Every day | ORAL | Status: DC
  Administered 2018-06-18 – 2018-06-30 (×12): 1 mg via ORAL
  Filled 2018-06-18 (×12): qty 1

## 2018-06-18 MED ORDER — ADULT MULTIVITAMIN W/MINERALS CH
1.0000 | ORAL_TABLET | Freq: Every day | ORAL | Status: DC
  Administered 2018-06-18 – 2018-06-30 (×13): 1 via ORAL
  Filled 2018-06-18 (×13): qty 1

## 2018-06-18 MED ORDER — OLANZAPINE 5 MG PO TABS
5.0000 mg | ORAL_TABLET | Freq: Every day | ORAL | Status: DC
  Administered 2018-06-18 – 2018-06-21 (×4): 5 mg via ORAL
  Filled 2018-06-18 (×5): qty 1

## 2018-06-18 MED ORDER — PANTOPRAZOLE SODIUM 40 MG PO TBEC
40.0000 mg | DELAYED_RELEASE_TABLET | Freq: Every day | ORAL | Status: DC
  Administered 2018-06-18 – 2018-06-30 (×13): 40 mg via ORAL
  Filled 2018-06-18 (×12): qty 1

## 2018-06-18 MED ORDER — VITAMIN B-1 100 MG PO TABS
100.0000 mg | ORAL_TABLET | Freq: Every day | ORAL | Status: DC
  Administered 2018-06-18 – 2018-06-30 (×13): 100 mg via ORAL
  Filled 2018-06-18 (×13): qty 1

## 2018-06-18 NOTE — Progress Notes (Signed)
Patients sister Meriam Sprague called for an update. Answered all questions and concerns. Sister Meriam Sprague appreciative of all that we are doing for her brother.

## 2018-06-18 NOTE — Progress Notes (Signed)
[facility] Department and AHF Team  ReDS Research Project   Patient #:  [id number]  ReDS Measurement  Right:  44%  Left: 44%

## 2018-06-18 NOTE — Progress Notes (Signed)
PROGRESS NOTE                                                                                                                                                                                                             Patient Demographics:    [name] [name], is a 63 y.o. male, DOB - 03/24/1955, JOI:[id number]  Outpatient Primary MD for the patient is Patient, No Pcp Per    LOS - 1  Admit date - 06/17/2018    CC - Fever     Brief Narrative   [name] [name] is a 63 y.o. male with medical history significant of alcohol abuse, encephalopathy was tested positive for COVID-19 at his skilled nursing facility approximately 2 days ago was found to be more short of breath hypoxic and confused today so therefore sent to [facility] for hospitalization.   Subjective:    [name] [name] today has, No headache, No chest pain, No abdominal pain - No Nausea, No new weakness tingling or numbness,   Cough - SOB.    Assessment  & Plan :     1. Viral pneumonitis due to Acute Covid 19 Viral Illness during the ongoing 2020 Covid 19 Pandemic -patient was diagnosed with COVID-19 infection at SNF 2 days prior to hospital admission.  Currently only mild hypoxia but inflammatory markers are borderline elevated and there is chest x-ray evidence of pneumonitis.  Will start him on steroids and trend, low threshold for going up to Actemra.  Flutter valve added for pulmonary toiletry, encouraged to sit up in chair and daytime and prone in bed at night.   COVID-19 Labs  Recent Labs    06/17/18 1512 06/17/18 2220 06/18/18 0310  DDIMER  --  1.10*  --   FERRITIN 396*  --  818*  CRP 4.9*  --  15.0*    No results found for: SARSCOV2NAA   No results found for: BNP  No results for input(s): PROBNP in the last 8760 hours.   2.  History of alcohol abuse.  Currently at SNF and abstaining from alcohol.  Monitor.  No signs of DTs.  Continue oral  thiamine and folic acid supplementation.  4.  Mild toxic encephalopathy due to COVID-19 infection.  Supportive care, at risk for worsening delirium, minimize narcotics and benzodiazepines.  Low home dose Klonopin continued to prevent  abrupt withdrawal.  Continue home dose Zyprexa.     5.  Essential hypertension.  Resume home dose Norvasc.  6.  Dyslipidemia.  Resume Lipitor.  Condition -  Fair  Family Communication  :  None  Code Status :  Full  Diet : Regular  Disposition Plan  :  SNF  Consults  :  None  Procedures  :  None  PUD Prophylaxis : PPI  DVT Prophylaxis  :  Lovenox    Lab Results  Component Value Date   PLT 193 06/18/2018    Inpatient Medications  Scheduled Meds: . aspirin EC  81 mg Oral Daily  . enoxaparin (LOVENOX) injection  40 mg Subcutaneous Q24H  . methylPREDNISolone (SOLU-MEDROL) injection  80 mg Intravenous Q12H  . sodium chloride flush  3 mL Intravenous Q12H  . vitamin C  500 mg Oral Daily  . zinc sulfate  220 mg Oral Daily   Continuous Infusions: . sodium chloride     PRN Meds:.sodium chloride, acetaminophen, chlorpheniramine-HYDROcodone, guaiFENesin-dextromethorphan, ondansetron **OR** ondansetron (ZOFRAN) IV, sodium chloride flush  Antibiotics  :    Anti-infectives (From admission, onward)   None       Time Spent in minutes  30   Susa Raring M.D on 06/18/2018 at 11:40 AM  To page go to [email or web address] - password [facility]  [facility] -  Office  [phone]   See all Orders from today for further details    Objective:   Vitals:   06/17/18 2200 06/18/18 0000 06/18/18 0529 06/18/18 0723  BP: (!) 89/66 118/76 94/73   Pulse: 71 89 64   Resp: (!) 30 (!) 28 16   Temp: 99.8 F (37.7 C) (!) 100.6 F (38.1 C) (!) 97.5 F (36.4 C) 97.7 F (36.5 C)  TempSrc: Oral Oral Oral   SpO2: 94% 94% 94%     Wt Readings from Last 3 Encounters:  06/17/18 81.6 kg  12/11/15 81.6 kg  05/23/13 74 kg     Intake/Output Summary (Last  24 hours) at 06/18/2018 1140 Last data filed at 06/18/2018 0300 Gross per 24 hour  Intake 339.4 ml  Output -  Net 339.4 ml     Physical Exam  Awake, alert, No new F.N deficits,   [place].AT,PERRAL Supple Neck,No JVD, No cervical lymphadenopathy appriciated.  Symmetrical Chest wall movement, Good air movement bilaterally, CTAB RRR,No Gallops,Rubs or new Murmurs, No Parasternal Heave +ve B.Sounds, Abd Soft, No tenderness, No organomegaly appriciated, No rebound - guarding or rigidity. No Cyanosis, Clubbing or edema, No new Rash or bruise       Data Review:    CBC Recent Labs  Lab 06/17/18 1327 06/17/18 2220 06/18/18 0310  WBC 7.5 9.5 8.1  HGB 15.6 14.0 14.6  HCT 46.5 42.1 43.7  PLT 187 192 193  MCV 87.1 88.3 87.9  MCH 29.2 29.4 29.4  MCHC 33.5 33.3 33.4  RDW 13.8 13.9 13.9  LYMPHSABS 1.5  --  1.0  MONOABS 0.6  --  0.4  EOSABS 0.0  --  0.0  BASOSABS 0.0  --  0.0    Chemistries  Recent Labs  Lab 06/17/18 1327 06/17/18 2220 06/18/18 0310  NA 141  --  138  K 3.4*  --  4.0  CL 102  --  105  CO2 28  --  20*  GLUCOSE 133*  --  120*  BUN 20  --  22  CREATININE 1.14 1.04 1.12  CALCIUM 8.5*  --  8.1*  AST 38  --  42*  ALT 17  --  17  ALKPHOS 38  --  32*  BILITOT 0.7  --  1.2   ------------------------------------------------------------------------------------------------------------------ No results for input(s): CHOL, HDL, LDLCALC, TRIG, CHOLHDL, LDLDIRECT in the last 72 hours.  Lab Results  Component Value Date   HGBA1C 5.6 05/19/2013   ------------------------------------------------------------------------------------------------------------------ No results for input(s): TSH, T4TOTAL, T3FREE, THYROIDAB in the last 72 hours.  Invalid input(s): FREET3  Cardiac Enzymes No results for input(s): CKMB, TROPONINI, MYOGLOBIN in the last 168 hours.  Invalid input(s): CK  ------------------------------------------------------------------------------------------------------------------ No results found for: BNP  Micro Results Recent Results (from the past 240 hour(s))  Blood Culture (routine x 2)     Status: None (Preliminary result)   Collection Time: 06/17/18  1:27 PM  Result Value Ref Range Status   Specimen Description BLOOD BLOOD LEFT ARM  Final   Special Requests   Final    BOTTLES DRAWN AEROBIC AND ANAEROBIC Blood Culture adequate volume   Culture   Final    NO GROWTH < 24 HOURS Performed at [facility], [address] [place], [place] [address]    Report Status PENDING  Incomplete  Blood Culture (routine x 2)     Status: None (Preliminary result)   Collection Time: 06/17/18  1:28 PM  Result Value Ref Range Status   Specimen Description BLOOD LEFT ANTECUBITAL  Final   Special Requests   Final    BOTTLES DRAWN AEROBIC AND ANAEROBIC Blood Culture adequate volume   Culture   Final    NO GROWTH < 24 HOURS Performed at [facility], [address] [place], [place] [address]    Report Status PENDING  Incomplete    Radiology Reports Dg Chest Port 1 View  Result Date: 06/17/2018 CLINICAL DATA:  Fever EXAM: PORTABLE CHEST 1 VIEW COMPARISON:  May 19, 2013 FINDINGS: There are foci of airspace consolidation in each upper and lower lobe region. There is a small left pleural effusion. Heart size and pulmonary vascularity are normal. No adenopathy. No bone lesions. IMPRESSION: Multifocal pneumonia. Small left pleural effusion. No adenopathy evident. Electronically Signed   By: Bretta BangWilliam  Woodruff III M.D.   On: 06/17/2018 14:13

## 2018-06-18 NOTE — TOC Initial Note (Signed)
Transition of Care [facility]) - Initial/Assessment Note    Patient Details  Name: [name] [name] MRN: [id number] Date of Birth: 09/11/1955  [facility] CM/SW Contact:    Gildardo Griffes, [place] Phone Number: [phone] 06/18/2018, 2:21 PM  Clinical Narrative:                  CSW consulted with patient's sister Meriam Sprague to introduce role and provide support as needed. [facility] appreciative of the call and confirms patient is from [facility] and has been living there for 3 years. Meriam Sprague is in agreement with patient returning to [facility] when medically stable to discharge. CSW will continue to follow for discharge planning needs and continued support.   Expected Discharge Plan: Skilled Nursing Facility Barriers to Discharge: Continued Medical Work up   Patient Goals and CMS Choice   CMS Medicare.gov Compare Post Acute Care list provided to:: Patient Represenative (must comment)([facility] (sister) ) Choice offered to / list presented to : Sibling  Expected Discharge Plan and Services Expected Discharge Plan: Skilled Nursing Facility     Post Acute Care Choice: Skilled Nursing Facility Living arrangements for the past 2 months: Skilled Nursing Facility                                      Prior Living Arrangements/Services Living arrangements for the past 2 months: Skilled Nursing Facility Lives with:: Self Patient language and need for interpreter reviewed:: Yes        Need for Family Participation in Patient Care: Yes (Comment) Care giver support system in place?: Yes (comment)   Criminal Activity/Legal Involvement Pertinent to Current Situation/Hospitalization: No - Comment as needed  Activities of Daily Living Home Assistive Devices/Equipment: Dan Humphreys (specify type) ADL Screening (condition at time of admission) Patient's cognitive ability adequate to safely complete daily activities?: No Is the patient deaf or have difficulty hearing?: No Does the  patient have difficulty seeing, even when wearing glasses/contacts?: No Does the patient have difficulty concentrating, remembering, or making decisions?: Yes Patient able to express need for assistance with ADLs?: Yes Does the patient have difficulty dressing or bathing?: No Independently performs ADLs?: Yes (appropriate for developmental age) Does the patient have difficulty walking or climbing stairs?: Yes Weakness of Legs: None Weakness of Arms/Hands: None  Permission Sought/Granted Permission sought to share information with : Case Manager, Magazine features editor, Family Supports Permission granted to share information with : Yes, Verbal Permission Granted  Share Information with NAME: Meriam Sprague  Permission granted to share info w AGENCY: SNFs  Permission granted to share info w Relationship: sister  Permission granted to share info w Contact Information: [phone]  Emotional Assessment Appearance:: Other (Comment Required(unable to assess - remote) Attitude/Demeanor/Rapport: Unable to Assess Affect (typically observed): Unable to Assess Orientation: : Oriented to Self Alcohol / Substance Use: Not Applicable Psych Involvement: No (comment)  Admission diagnosis:  COVID-19 VIRUS INFECTION Patient Active Problem List   Diagnosis Date Noted  . COVID-19 virus infection 06/17/2018  . Bilateral pneumonia 06/17/2018  . Acute respiratory failure with hypoxia (HCC) 06/17/2018  . Encephalopathy due to COVID-19 virus 06/17/2018  . Altered mental status 05/21/2013  . Alcohol abuse 05/21/2013  . Tobacco abuse 05/21/2013  . Hyponatremia 05/20/2013   PCP:  Patient, No Pcp Per Pharmacy:   [id number] - [place], [place] - [address] AT [employer] OF [address] [place] [id number] Phone: [phone] Fax: [phone]     Social Determinants of Health (SDOH) Interventions    Readmission Risk Interventions No  flowsheet data found.

## 2018-06-19 ENCOUNTER — Inpatient Hospital Stay (HOSPITAL_COMMUNITY): Payer: Medicare Other

## 2018-06-19 LAB — COMPREHENSIVE METABOLIC PANEL
ALT: 19 U/L (ref 0–44)
AST: 41 U/L (ref 15–41)
Albumin: 3.2 g/dL — ABNORMAL LOW (ref 3.5–5.0)
Alkaline Phosphatase: 38 U/L (ref 38–126)
Anion gap: 10 (ref 5–15)
BUN: 25 mg/dL — ABNORMAL HIGH (ref 8–23)
CO2: 29 mmol/L (ref 22–32)
Calcium: 8.6 mg/dL — ABNORMAL LOW (ref 8.9–10.3)
Chloride: 102 mmol/L (ref 98–111)
Creatinine, Ser: 0.95 mg/dL (ref 0.61–1.24)
GFR calc Af Amer: >60 mL/min (ref >60)
GFR calc non Af Amer: >60 mL/min (ref >60)
Glucose, Bld: 171 mg/dL — ABNORMAL HIGH (ref 70–99)
Potassium: 3.4 mmol/L — ABNORMAL LOW (ref 3.5–5.1)
Sodium: 141 mmol/L (ref 135–145)
Total Bilirubin: 0.4 mg/dL (ref 0.3–1.2)
Total Protein: 7 g/dL (ref 6.5–8.1)

## 2018-06-19 LAB — CBC WITH DIFFERENTIAL/PLATELET
Abs Immature Granulocytes: 0.2 10*3/uL — ABNORMAL HIGH (ref 0.00–0.07)
Band Neutrophils: 10 %
Basophils Absolute: 0 10*3/uL (ref 0.0–0.1)
Basophils Relative: 0 %
Eosinophils Absolute: 0 10*3/uL (ref 0.0–0.5)
Eosinophils Relative: 0 %
HCT: 42.4 % (ref 39.0–52.0)
Hemoglobin: 14.1 g/dL (ref 13.0–17.0)
Lymphocytes Relative: 6 %
Lymphs Abs: 1 10*3/uL (ref 0.7–4.0)
MCH: 29.4 pg (ref 26.0–34.0)
MCHC: 33.3 g/dL (ref 30.0–36.0)
MCV: 88.5 fL (ref 80.0–100.0)
Monocytes Absolute: 0.5 10*3/uL (ref 0.1–1.0)
Monocytes Relative: 3 %
Myelocytes: 1 %
Neutro Abs: 14.9 10*3/uL — ABNORMAL HIGH (ref 1.7–7.7)
Neutrophils Relative %: 80 %
Platelets: 232 10*3/uL (ref 150–400)
RBC: 4.79 MIL/uL (ref 4.22–5.81)
RDW: 13.9 % (ref 11.5–15.5)
WBC: 16.5 10*3/uL — ABNORMAL HIGH (ref 4.0–10.5)
nRBC: 0 % (ref 0.0–0.2)

## 2018-06-19 LAB — C-REACTIVE PROTEIN: CRP: 11.4 mg/dL — ABNORMAL HIGH (ref <1.0)

## 2018-06-19 LAB — BRAIN NATRIURETIC PEPTIDE: B Natriuretic Peptide: 82.6 pg/mL (ref 0.0–100.0)

## 2018-06-19 LAB — FERRITIN: Ferritin: 886 ng/mL — ABNORMAL HIGH (ref 24–336)

## 2018-06-19 LAB — MAGNESIUM: Magnesium: 2.1 mg/dL (ref 1.7–2.4)

## 2018-06-19 LAB — D-DIMER, QUANTITATIVE: D-Dimer, Quant: 1.26 ug/mL-FEU — ABNORMAL HIGH (ref 0.00–0.50)

## 2018-06-19 MED ORDER — AMLODIPINE BESYLATE 5 MG PO TABS
5.0000 mg | ORAL_TABLET | Freq: Every day | ORAL | Status: DC
  Administered 2018-06-19 – 2018-06-22 (×4): 5 mg via ORAL
  Filled 2018-06-19 (×4): qty 1

## 2018-06-19 MED ORDER — TOCILIZUMAB 400 MG/20ML IV SOLN
600.0000 mg | Freq: Once | INTRAVENOUS | Status: AC
  Administered 2018-06-19: 600 mg via INTRAVENOUS
  Filled 2018-06-19: qty 30

## 2018-06-19 MED ORDER — POTASSIUM CHLORIDE CRYS ER 20 MEQ PO TBCR
40.0000 meq | EXTENDED_RELEASE_TABLET | Freq: Once | ORAL | Status: AC
  Administered 2018-06-19: 40 meq via ORAL
  Filled 2018-06-19: qty 2

## 2018-06-19 MED ORDER — METHYLPREDNISOLONE SODIUM SUCC 125 MG IJ SOLR
40.0000 mg | Freq: Three times a day (TID) | INTRAMUSCULAR | Status: DC
  Administered 2018-06-19 – 2018-06-20 (×3): 40 mg via INTRAVENOUS
  Filled 2018-06-19 (×3): qty 2

## 2018-06-19 MED ORDER — METHYLPREDNISOLONE SODIUM SUCC 125 MG IJ SOLR: 40.0000 mg | Freq: Two times a day (BID) | INTRAMUSCULAR | Status: DC

## 2018-06-19 MED ORDER — FUROSEMIDE 10 MG/ML IJ SOLN
40.0000 mg | Freq: Once | INTRAMUSCULAR | Status: AC
  Administered 2018-06-19: 40 mg via INTRAVENOUS
  Filled 2018-06-19: qty 4

## 2018-06-19 MED ORDER — TOCILIZUMAB 400 MG/20ML IV SOLN
600.0000 mg | Freq: Once | INTRAVENOUS | Status: DC
  Filled 2018-06-19: qty 30

## 2018-06-19 MED ORDER — ENOXAPARIN SODIUM 40 MG/0.4ML ~~LOC~~ SOLN
40.0000 mg | Freq: Two times a day (BID) | SUBCUTANEOUS | Status: DC
  Administered 2018-06-19 – 2018-06-30 (×23): 40 mg via SUBCUTANEOUS
  Filled 2018-06-19 (×24): qty 0.4

## 2018-06-19 NOTE — Progress Notes (Signed)
Spent significant time with patient and established his mother lives here in [place]. Offered to call patients mother, Dewayne Hatch, and patient provided her phone number from memory. Spoke with Dewayne Hatch who was shocked patient was admitted and she was not aware. This nurse informed her she was not part of patients emergency contact and upon patient approval added her to his emergency contact list. Updated her on patient medical details and plan of care as well as provided her with reception desk phone number.

## 2018-06-19 NOTE — Progress Notes (Signed)
Pt oxygen on HFNC increased to 12L. Pulse ox. Reading 89-93% on current setting.   Pt de-sats to low 80's when ambulating/ acitivity. Encouraged pt for prone positioning. Pt not compliant with measures.

## 2018-06-19 NOTE — Progress Notes (Signed)
Patient weaned down from 15 L HF [place] to 10 L HF [place] stating 96%. Encouraged and educated on proning, however, patient refused

## 2018-06-19 NOTE — Progress Notes (Signed)
CardioVascular Research Department and AHF Team  ReDS Research Project   Patient #: [id number]  ReDS Measurement  Right: 42 %  Left: 47 %

## 2018-06-19 NOTE — Progress Notes (Addendum)
PROGRESS NOTE                                                                                                                                                                                                             [name] [name] Demographics:    [name] [name], is a 63 y.o. male, DOB - 02-Aug-1955, ZOX:[id number]  Outpatient Primary MD for the [name] [name] is [name] [name], No Pcp Per    LOS - 2  No chief complaint on file.      Brief Narrative: [name] [name] is a 63 y.o. male with PMHx of alcohol abuse-presenting with acute hypoxic respiratory failure in the setting of COVID-19 viral pneumonia.   Subjective:    Bear Osten today feels weak-short of breath with minimal activity.  Oxygen requirements have increased overnight.   Assessment  & Plan :   Acute Hypoxic Resp Failure due to Covid 19 Viral pneumonia: Worsening oxygen requirements-on 12-13 L of high flow oxygen this morning-however he is not in acute distress at rest-looks comfortable.  [name] [name] received Actemra x1 on admission, on steroids empirically.  Repeat another dose of Actemra today-if worsening continues-we will start [name] [name] on  remdesivir x 5days.  Will give 1 dose of IV Lasix as well.  Given worsening hypoxia-we will change Lovenox to twice daily dosing.  COVID-19 Labs:  Recent Labs    06/17/18 1512 06/17/18 2220 06/18/18 0310 06/19/18 0407 06/19/18 0412  DDIMER  --  1.10*  --  1.26*  --   FERRITIN 396*  --  818*  --  886*  CRP 4.9*  --  15.0*  --  11.4*   Acute metabolic encephalopathy: Improved-suspect secondary to hypoxia.  Hypertension: Blood pressure stable-continue Norvasc  Dyslipidemia: Continue statin  History of paranoia/alcohol abuse: At SNF-and abstaining from alcohol-continue Zyprexa, Klonopin  The treatment plan,use of Actemra and its known side effects were discussed with [name] [name], it was clearly explained that there is no proven definitive  treatment for COVID-19 infection, any medications used here are based on published clinical articles/anecdotal data which were not obtained by randomized controlled trials.  Complete risks and long-term side effects are unknown, however in the current scenario of ongoing [name] [name] deterioration,the clinical judgment is that the benefit may outweigh the risks. [name] [name] agree's with the treatment plan and consent to receive the recommended medications.    ABG:  Component Value Date/Time   HCO3 27.2 06/17/2018 1327   O2SAT 48.8 06/17/2018 1327    Condition - Extremely Guarded  Family Communication  : None-[name] [name] did not want me to call his daughter (who apparently lives in [place]) he will update her himself.  Code Status :  Full Code  Diet : heart healthy  Disposition Plan  :  Remain inpatient  Consults  :  None  Procedures  :   None  DVT Prophylaxis  :  Lovenox  Lab Results  Component Value Date   PLT 232 06/19/2018    Inpatient Medications  Scheduled Meds: . aspirin EC  81 mg Oral Daily  . atorvastatin  10 mg Oral QHS  . clonazePAM  0.5 mg Oral BID  . enoxaparin (LOVENOX) injection  40 mg Subcutaneous Q24H  . folic acid  1 mg Oral Daily  . methylPREDNISolone (SOLU-MEDROL) injection  80 mg Intravenous Q12H  . multivitamin with minerals  1 tablet Oral Daily  . OLANZapine  5 mg Oral QHS  . pantoprazole  40 mg Oral Daily  . sodium chloride flush  3 mL Intravenous Q12H  . thiamine  100 mg Oral Daily  . vitamin C  500 mg Oral Daily  . zinc sulfate  220 mg Oral Daily   Continuous Infusions: . sodium chloride    . tocilizumab (ACTEMRA) IV     PRN Meds:.sodium chloride, acetaminophen, chlorpheniramine-HYDROcodone, guaiFENesin-dextromethorphan, ondansetron **OR** ondansetron (ZOFRAN) IV, sodium chloride flush  Antibiotics  :    Anti-infectives (From admission, onward)   None       Time Spent in minutes  35    Jeoffrey MassedShanker Genoa Freyre M.D on 06/19/2018 at 10:05 AM  To  page go to [email or web address] - use universal password  [facility] -  Office  [phone]  See all Orders from today for further details   Admit date - 06/17/2018    2    Objective:   Vitals:   06/19/18 0010 06/19/18 0011 06/19/18 0527 06/19/18 0902  BP:   105/79 (!) 137/105  Pulse: 62 64 61 79  Resp:   18   Temp:   (!) 97.4 F (36.3 C) (!) 97.5 F (36.4 C)  TempSrc:   Oral Oral  SpO2: 91% 92% 94% 93%    Wt Readings from Last 3 Encounters:  06/17/18 81.6 kg  12/11/15 81.6 kg  05/23/13 74 kg    No intake or output data in the 24 hours ending 06/19/18 1005   Physical Exam Gen Exam:Alert awake-not in any distress HEENT:atraumatic, normocephalic Chest: Bibasilar rales CVS:S1S2 regular Abdomen:soft non tender, non distended Extremities:no edema Neurology: non focal Skin: no rash   Data Review:    CBC Recent Labs  Lab 06/17/18 1327 06/17/18 2220 06/18/18 0310 06/19/18 0407  WBC 7.5 9.5 8.1 16.5*  HGB 15.6 14.0 14.6 14.1  HCT 46.5 42.1 43.7 42.4  PLT 187 192 193 232  MCV 87.1 88.3 87.9 88.5  MCH 29.2 29.4 29.4 29.4  MCHC 33.5 33.3 33.4 33.3  RDW 13.8 13.9 13.9 13.9  LYMPHSABS 1.5  --  1.0 1.0  MONOABS 0.6  --  0.4 0.5  EOSABS 0.0  --  0.0 0.0  BASOSABS 0.0  --  0.0 0.0    Chemistries  Recent Labs  Lab 06/17/18 1327 06/17/18 2220 06/18/18 0310 06/19/18 0407  NA 141  --  138 141  K 3.4*  --  4.0 3.4*  CL 102  --  105 102  CO2 28  --  20* 29  GLUCOSE 133*  --  120* 171*  BUN 20  --  22 25*  CREATININE 1.14 1.04 1.12 0.95  CALCIUM 8.5*  --  8.1* 8.6*  MG  --   --   --  2.1  AST 38  --  42* 41  ALT 17  --  17 19  ALKPHOS 38  --  32* 38  BILITOT 0.7  --  1.2 0.4   ------------------------------------------------------------------------------------------------------------------ No results for input(s): CHOL, HDL, LDLCALC, TRIG, CHOLHDL, LDLDIRECT in the last 72 hours.  Lab Results  Component Value Date   HGBA1C 5.6 05/19/2013    ------------------------------------------------------------------------------------------------------------------ No results for input(s): TSH, T4TOTAL, T3FREE, THYROIDAB in the last 72 hours.  Invalid input(s): FREET3 ------------------------------------------------------------------------------------------------------------------ Recent Labs    06/18/18 0310 06/19/18 0412  FERRITIN 818* 886*    Coagulation profile No results for input(s): INR, PROTIME in the last 168 hours.  Recent Labs    06/17/18 2220 06/19/18 0407  DDIMER 1.10* 1.26*    Cardiac Enzymes No results for input(s): CKMB, TROPONINI, MYOGLOBIN in the last 168 hours.  Invalid input(s): CK ------------------------------------------------------------------------------------------------------------------    Component Value Date/Time   BNP 82.6 06/19/2018 0407    Micro Results Recent Results (from the past 240 hour(s))  Blood Culture (routine x 2)     Status: None (Preliminary result)   Collection Time: 06/17/18  1:27 PM  Result Value Ref Range Status   Specimen Description BLOOD BLOOD LEFT ARM  Final   Special Requests   Final    BOTTLES DRAWN AEROBIC AND ANAEROBIC Blood Culture adequate volume   Culture   Final    NO GROWTH 2 DAYS Performed at [facility], [address] [place], [place] [address]    Report Status PENDING  Incomplete  Blood Culture (routine x 2)     Status: None (Preliminary result)   Collection Time: 06/17/18  1:28 PM  Result Value Ref Range Status   Specimen Description BLOOD LEFT ANTECUBITAL  Final   Special Requests   Final    BOTTLES DRAWN AEROBIC AND ANAEROBIC Blood Culture adequate volume   Culture   Final    NO GROWTH 2 DAYS Performed at [facility], [address], [place], [place] [address]    Report Status PENDING  Incomplete    Radiology Reports Dg Chest Port 1 View  Result Date: 06/17/2018 CLINICAL DATA:  Fever EXAM: PORTABLE CHEST 1 VIEW  COMPARISON:  May 19, 2013 FINDINGS: There are foci of airspace consolidation in each upper and lower lobe region. There is a small left pleural effusion. Heart size and pulmonary vascularity are normal. No adenopathy. No bone lesions. IMPRESSION: Multifocal pneumonia. Small left pleural effusion. No adenopathy evident. Electronically Signed   By: Bretta Bang III M.D.   On: 06/17/2018 14:13   Dg Chest Port 1v Same Day  Result Date: 06/19/2018 CLINICAL DATA:  Shortness of breath. EXAM: PORTABLE CHEST 1 VIEW COMPARISON:  Radiograph of Jun 17, 2018. FINDINGS: The heart size and mediastinal contours are within normal limits. No pneumothorax or pleural effusion is noted. Mildly decreased bilateral lung opacities are noted suggesting improving multifocal pneumonia. The visualized skeletal structures are unremarkable. IMPRESSION: Mildly decreased bilateral lung opacities are noted suggesting improving multifocal pneumonia. Electronically Signed   By: Lupita Raider M.D.   On: 06/19/2018 09:14

## 2018-06-20 ENCOUNTER — Encounter (HOSPITAL_COMMUNITY): Payer: Self-pay

## 2018-06-20 LAB — COMPREHENSIVE METABOLIC PANEL
ALT: 22 U/L (ref 0–44)
AST: 48 U/L — ABNORMAL HIGH (ref 15–41)
Albumin: 3.2 g/dL — ABNORMAL LOW (ref 3.5–5.0)
Alkaline Phosphatase: 45 U/L (ref 38–126)
Anion gap: 12 (ref 5–15)
BUN: 28 mg/dL — ABNORMAL HIGH (ref 8–23)
CO2: 23 mmol/L (ref 22–32)
Calcium: 8.5 mg/dL — ABNORMAL LOW (ref 8.9–10.3)
Chloride: 105 mmol/L (ref 98–111)
Creatinine, Ser: 0.94 mg/dL (ref 0.61–1.24)
GFR calc Af Amer: >60 mL/min (ref >60)
GFR calc non Af Amer: >60 mL/min (ref >60)
Glucose, Bld: 174 mg/dL — ABNORMAL HIGH (ref 70–99)
Potassium: 3.3 mmol/L — ABNORMAL LOW (ref 3.5–5.1)
Sodium: 140 mmol/L (ref 135–145)
Total Bilirubin: 0.3 mg/dL (ref 0.3–1.2)
Total Protein: 6.7 g/dL (ref 6.5–8.1)

## 2018-06-20 LAB — CBC WITH DIFFERENTIAL/PLATELET
Abs Immature Granulocytes: 0.41 10*3/uL — ABNORMAL HIGH (ref 0.00–0.07)
Basophils Absolute: 0 10*3/uL (ref 0.0–0.1)
Basophils Relative: 0 %
Eosinophils Absolute: 0 10*3/uL (ref 0.0–0.5)
Eosinophils Relative: 0 %
HCT: 40 % (ref 39.0–52.0)
Hemoglobin: 13.7 g/dL (ref 13.0–17.0)
Immature Granulocytes: 3 %
Lymphocytes Relative: 9 %
Lymphs Abs: 1.4 10*3/uL (ref 0.7–4.0)
MCH: 29.7 pg (ref 26.0–34.0)
MCHC: 34.3 g/dL (ref 30.0–36.0)
MCV: 86.8 fL (ref 80.0–100.0)
Monocytes Absolute: 0.5 10*3/uL (ref 0.1–1.0)
Monocytes Relative: 3 %
Neutro Abs: 14.2 10*3/uL — ABNORMAL HIGH (ref 1.7–7.7)
Neutrophils Relative %: 85 %
Platelets: 290 10*3/uL (ref 150–400)
RBC: 4.61 MIL/uL (ref 4.22–5.81)
RDW: 13.6 % (ref 11.5–15.5)
WBC: 16.5 10*3/uL — ABNORMAL HIGH (ref 4.0–10.5)
nRBC: 0 % (ref 0.0–0.2)

## 2018-06-20 LAB — FERRITIN: Ferritin: 896 ng/mL — ABNORMAL HIGH (ref 24–336)

## 2018-06-20 LAB — MAGNESIUM: Magnesium: 2.1 mg/dL (ref 1.7–2.4)

## 2018-06-20 LAB — C-REACTIVE PROTEIN: CRP: 4 mg/dL — ABNORMAL HIGH (ref <1.0)

## 2018-06-20 LAB — D-DIMER, QUANTITATIVE: D-Dimer, Quant: 0.58 ug/mL-FEU — ABNORMAL HIGH (ref 0.00–0.50)

## 2018-06-20 MED ORDER — POTASSIUM CHLORIDE CRYS ER 20 MEQ PO TBCR
40.0000 meq | EXTENDED_RELEASE_TABLET | Freq: Once | ORAL | Status: AC
  Administered 2018-06-20: 13:00:00 40 meq via ORAL
  Filled 2018-06-20: qty 2

## 2018-06-20 MED ORDER — SODIUM CHLORIDE 0.9 % IV SOLN
200.0000 mg | Freq: Once | INTRAVENOUS | Status: AC
  Administered 2018-06-20: 14:00:00 200 mg via INTRAVENOUS
  Filled 2018-06-20 (×2): qty 40

## 2018-06-20 MED ORDER — LORAZEPAM 2 MG/ML IJ SOLN
0.5000 mg | INTRAMUSCULAR | Status: DC | PRN
  Administered 2018-06-20 – 2018-06-22 (×7): 0.5 mg via INTRAVENOUS
  Filled 2018-06-20 (×7): qty 1

## 2018-06-20 MED ORDER — METHYLPREDNISOLONE SODIUM SUCC 125 MG IJ SOLR
40.0000 mg | Freq: Two times a day (BID) | INTRAMUSCULAR | Status: DC
  Administered 2018-06-20: 40 mg via INTRAVENOUS
  Filled 2018-06-20: qty 2

## 2018-06-20 MED ORDER — SODIUM CHLORIDE 0.9 % IV SOLN
100.0000 mg | INTRAVENOUS | Status: DC
  Administered 2018-06-22 – 2018-06-24 (×3): 100 mg via INTRAVENOUS
  Filled 2018-06-20 (×4): qty 20

## 2018-06-20 MED ORDER — LORAZEPAM 2 MG/ML IJ SOLN
0.5000 mg | INTRAMUSCULAR | Status: DC
  Administered 2018-06-20: 0.5 mg via INTRAVENOUS

## 2018-06-20 NOTE — Progress Notes (Signed)
Patient appears to be extremely agitated tonight restless and refusing to wear oxygen and taking off all equipment. Stating 80 RA and asking for a six pack of beer. Paged DR. Onalee Hua and due to ETOH history going to attempt ativan will closely monitor and stay near patient

## 2018-06-20 NOTE — Progress Notes (Signed)
PROGRESS NOTE                                                                                                                                                                                                             Patient Demographics:    [name] [name], is a 63 y.o. male, DOB - 22-Aug-1955, ZTI:[id number]  Outpatient Primary MD for the patient is Patient, No Pcp Per    LOS - 3  No chief complaint on file.      Brief Narrative: Patient is a 63 y.o. male with PMHx of alcohol abuse-presenting with acute hypoxic respiratory failure in the setting of COVID-19 viral pneumonia.   Subjective:    [name] [name] remains unchanged-still short of breath with minimal activity-requiring 10 L of high flow O2 to maintain saturations-Per RN-even at 10 L-occasionally O2 saturations will drop down to the high 80s.   Assessment  & Plan :   Acute Hypoxic Resp Failure due to Covid 19 Viral pneumonia: Still with significant oxygen requirements-no tangible improvement with Actemra x2, empiric Solu-Medrol-and 1 dose of Lasix.  Per RN-unable to titrate of FiO2 down consistently to 10 L as he still desaturates.  Thankfully he is not in any sort of respiratory distress.  No signs of volume overload.  Although his inflammatory markers are slowly downtrending-he continues to have significant amount of hypoxia-hence we will go ahead and start Remdesivir x 5 days (d/w pt this morning).  Will titrate off steroids as well now that he has completed 3 days of IV steroids without any significant improvement.  COVID-19 Labs:  Recent Labs    06/17/18 2220 06/18/18 0310 06/19/18 0407 06/19/18 0412 06/20/18 0742  DDIMER 1.10*  --  1.26*  --  0.58*  FERRITIN  --  818*  --  886* 896*  CRP  --  15.0*  --  11.4* 4.0*   COVID-19 medications: 5/22>> RemdesIvir 5/21>> Actemra 5/20>> Solu-Medrol 5/19 >>Actemra  Acute metabolic encephalopathy:  Resolved-completely awake and alert-suspect this could have been secondary to hypoxia.   Hypertension: Controlled-continue with amlodipine  Dyslipidemia: Hold statins for now  History of paranoia/alcohol abuse: At SNF-and abstaining from alcohol-continue Zyprexa, Klonopin.  No signs of withdrawal at this point  The treatment plan,use of Actemra/Solu-Medrol and its known side effects were discussed with patient, it was clearly explained that there is  no proven definitive treatment for COVID-19 infection, any medications used here are based on published clinical articles/anecdotal data which were not obtained by randomized controlled trials.  Complete risks and long-term side effects are unknown, however in the current scenario of ongoing patient deterioration,the clinical judgment is that the benefit may outweigh the risks. Patient agree's with the treatment plan and consent to receive the recommended medications.    ABG:    Component Value Date/Time   HCO3 27.2 06/17/2018 1327   O2SAT 48.8 06/17/2018 1327    Condition - Extremely Guarded  Family Communication  : Patient is awake and alert-he does not want this MD to call family members.  Per him he will update his daughter who apparently lives in [place] himself.  Code Status :  Full Code  Diet : heart healthy  Disposition Plan  :  Remain inpatient  Consults  :  None  Procedures  :   None  DVT Prophylaxis  :  Lovenox twice daily dosing given significant hypoxia  Lab Results  Component Value Date   PLT 290 06/20/2018    Inpatient Medications  Scheduled Meds: . amLODipine  5 mg Oral Daily  . aspirin EC  81 mg Oral Daily  . atorvastatin  10 mg Oral QHS  . clonazePAM  0.5 mg Oral BID  . enoxaparin (LOVENOX) injection  40 mg Subcutaneous BID  . folic acid  1 mg Oral Daily  . methylPREDNISolone (SOLU-MEDROL) injection  40 mg Intravenous Q8H  . multivitamin with minerals  1 tablet Oral Daily  . OLANZapine  5 mg Oral QHS   . pantoprazole  40 mg Oral Daily  . potassium chloride  40 mEq Oral Once  . sodium chloride flush  3 mL Intravenous Q12H  . thiamine  100 mg Oral Daily  . vitamin C  500 mg Oral Daily  . zinc sulfate  220 mg Oral Daily   Continuous Infusions: . sodium chloride Stopped (06/19/18 1600)   PRN Meds:.sodium chloride, acetaminophen, chlorpheniramine-HYDROcodone, guaiFENesin-dextromethorphan, ondansetron **OR** ondansetron (ZOFRAN) IV, sodium chloride flush  Antibiotics  :    Anti-infectives (From admission, onward)   None       Time Spent in minutes  35    Jeoffrey MassedShanker Cyndie Woodbeck M.D on 06/20/2018 at 12:03 PM  To page go to [email or web address] - use universal password  [facility] -  Office  [phone]  See all Orders from today for further details   Admit date - 06/17/2018    3    Objective:   Vitals:   06/19/18 2009 06/20/18 0510 06/20/18 0800 06/20/18 0900  BP: 90/71 (!) 104/56 101/69 96/72  Pulse: 85 64  70  Resp: 18 18 18    Temp: 98.5 F (36.9 C) 97.7 F (36.5 C) (!) 97.5 F (36.4 C) 97.9 F (36.6 C)  TempSrc:  Oral Oral Axillary  SpO2: 98% (!) 88% 92% 93%    Wt Readings from Last 3 Encounters:  06/17/18 81.6 kg  12/11/15 81.6 kg  05/23/13 74 kg     Intake/Output Summary (Last 24 hours) at 06/20/2018 1203 Last data filed at 06/20/2018 0900 Gross per 24 hour  Intake 460.11 ml  Output 200 ml  Net 260.11 ml     Physical Exam Gen Exam: Awake/alert-on 10-11 L of high flow O2.  No signs of acute distress.  Speaking in full sentences. HEENT: Atraumatic, normocephalic. Chest: Bibasilar rales  CVS: S1-S2 regular-no murmurs.   Abdomen: Soft, nontender nondistended. Extremities: No edema.   Neurology:  Nonfocal  skin: no rash   Data Review:    CBC Recent Labs  Lab 06/17/18 1327 06/17/18 2220 06/18/18 0310 06/19/18 0407 06/20/18 0742  WBC 7.5 9.5 8.1 16.5* 16.5*  HGB 15.6 14.0 14.6 14.1 13.7  HCT 46.5 42.1 43.7 42.4 40.0  PLT 187 192 193 232 290   MCV 87.1 88.3 87.9 88.5 86.8  MCH 29.2 29.4 29.4 29.4 29.7  MCHC 33.5 33.3 33.4 33.3 34.3  RDW 13.8 13.9 13.9 13.9 13.6  LYMPHSABS 1.5  --  1.0 1.0 1.4  MONOABS 0.6  --  0.4 0.5 0.5  EOSABS 0.0  --  0.0 0.0 0.0  BASOSABS 0.0  --  0.0 0.0 0.0    Chemistries  Recent Labs  Lab 06/17/18 1327 06/17/18 2220 06/18/18 0310 06/19/18 0407 06/20/18 0742  NA 141  --  138 141 140  K 3.4*  --  4.0 3.4* 3.3*  CL 102  --  105 102 105  CO2 28  --  20* 29 23  GLUCOSE 133*  --  120* 171* 174*  BUN 20  --  22 25* 28*  CREATININE 1.14 1.04 1.12 0.95 0.94  CALCIUM 8.5*  --  8.1* 8.6* 8.5*  MG  --   --   --  2.1 2.1  AST 38  --  42* 41 48*  ALT 17  --  17 19 22   ALKPHOS 38  --  32* 38 45  BILITOT 0.7  --  1.2 0.4 0.3   ------------------------------------------------------------------------------------------------------------------ No results for input(s): CHOL, HDL, LDLCALC, TRIG, CHOLHDL, LDLDIRECT in the last 72 hours.  Lab Results  Component Value Date   HGBA1C 5.6 05/19/2013   ------------------------------------------------------------------------------------------------------------------ No results for input(s): TSH, T4TOTAL, T3FREE, THYROIDAB in the last 72 hours.  Invalid input(s): FREET3 ------------------------------------------------------------------------------------------------------------------ Recent Labs    06/19/18 0412 06/20/18 0742  FERRITIN 886* 896*    Coagulation profile No results for input(s): INR, PROTIME in the last 168 hours.  Recent Labs    06/19/18 0407 06/20/18 0742  DDIMER 1.26* 0.58*    Cardiac Enzymes No results for input(s): CKMB, TROPONINI, MYOGLOBIN in the last 168 hours.  Invalid input(s): CK ------------------------------------------------------------------------------------------------------------------    Component Value Date/Time   BNP 82.6 06/19/2018 0407    Micro Results Recent Results (from the past 240 hour(s))  Blood  Culture (routine x 2)     Status: None (Preliminary result)   Collection Time: 06/17/18  1:27 PM  Result Value Ref Range Status   Specimen Description BLOOD BLOOD LEFT ARM  Final   Special Requests   Final    BOTTLES DRAWN AEROBIC AND ANAEROBIC Blood Culture adequate volume   Culture   Final    NO GROWTH 3 DAYS Performed at [facility], [address], [place], [place] [address]    Report Status PENDING  Incomplete  Blood Culture (routine x 2)     Status: None (Preliminary result)   Collection Time: 06/17/18  1:28 PM  Result Value Ref Range Status   Specimen Description BLOOD LEFT ANTECUBITAL  Final   Special Requests   Final    BOTTLES DRAWN AEROBIC AND ANAEROBIC Blood Culture adequate volume   Culture   Final    NO GROWTH 3 DAYS Performed at [facility], [address], [place], [place] [address]    Report Status PENDING  Incomplete    Radiology Reports Dg Chest Port 1 View  Result Date: 06/17/2018 CLINICAL DATA:  Fever EXAM: PORTABLE CHEST 1  VIEW COMPARISON:  May 19, 2013 FINDINGS: There are foci of airspace consolidation in each upper and lower lobe region. There is a small left pleural effusion. Heart size and pulmonary vascularity are normal. No adenopathy. No bone lesions. IMPRESSION: Multifocal pneumonia. Small left pleural effusion. No adenopathy evident. Electronically Signed   By: Bretta Bang III M.D.   On: 06/17/2018 14:13   Dg Chest Port 1v Same Day  Result Date: 06/19/2018 CLINICAL DATA:  Shortness of breath. EXAM: PORTABLE CHEST 1 VIEW COMPARISON:  Radiograph of Jun 17, 2018. FINDINGS: The heart size and mediastinal contours are within normal limits. No pneumothorax or pleural effusion is noted. Mildly decreased bilateral lung opacities are noted suggesting improving multifocal pneumonia. The visualized skeletal structures are unremarkable. IMPRESSION: Mildly decreased bilateral lung opacities are noted suggesting improving multifocal  pneumonia. Electronically Signed   By: Lupita Raider M.D.   On: 06/19/2018 09:14

## 2018-06-20 NOTE — Progress Notes (Signed)
Encouraged patient to prone and educated on importance. Patient refuses stating uncomfortable. Attempted to call patients sister Meriam Sprague but no answer. Patient bathed shaved and comfortable oxygen requirments 15 L NRB stating between 88-90 % will monitor closely and increase oxygen support if needed throughout night. Patient not very compliant- constantly taking oxygen off and pulling pulse ox as well as IV out of arm. Will closely monitor patient throughout night and chart in room as much as possible.

## 2018-06-20 NOTE — Progress Notes (Signed)
[facility] Research Department and AHF Team  ReDS Research Project   Patient #: [id number]  ReDS Measurement  Right: 49 %  Left: 55 %

## 2018-06-21 ENCOUNTER — Encounter (HOSPITAL_COMMUNITY): Payer: Self-pay | Admitting: Radiology

## 2018-06-21 ENCOUNTER — Inpatient Hospital Stay (HOSPITAL_COMMUNITY): Payer: Medicare Other

## 2018-06-21 DIAGNOSIS — U071 COVID-19: Principal | ICD-10-CM

## 2018-06-21 DIAGNOSIS — J9601 Acute respiratory failure with hypoxia: Secondary | ICD-10-CM

## 2018-06-21 LAB — CBC WITH DIFFERENTIAL/PLATELET
Abs Immature Granulocytes: 0.29 10*3/uL — ABNORMAL HIGH (ref 0.00–0.07)
Basophils Absolute: 0.1 10*3/uL (ref 0.0–0.1)
Basophils Relative: 1 %
Eosinophils Absolute: 0 10*3/uL (ref 0.0–0.5)
Eosinophils Relative: 0 %
HCT: 44.9 % (ref 39.0–52.0)
Hemoglobin: 14.8 g/dL (ref 13.0–17.0)
Immature Granulocytes: 2 %
Lymphocytes Relative: 10 %
Lymphs Abs: 1.6 10*3/uL (ref 0.7–4.0)
MCH: 29.1 pg (ref 26.0–34.0)
MCHC: 33 g/dL (ref 30.0–36.0)
MCV: 88.4 fL (ref 80.0–100.0)
Monocytes Absolute: 0.9 10*3/uL (ref 0.1–1.0)
Monocytes Relative: 5 %
Neutro Abs: 13.2 10*3/uL — ABNORMAL HIGH (ref 1.7–7.7)
Neutrophils Relative %: 82 %
Platelets: 325 10*3/uL (ref 150–400)
RBC: 5.08 MIL/uL (ref 4.22–5.81)
RDW: 13.8 % (ref 11.5–15.5)
WBC: 16 10*3/uL — ABNORMAL HIGH (ref 4.0–10.5)
nRBC: 0 % (ref 0.0–0.2)

## 2018-06-21 LAB — POCT I-STAT 7, (LYTES, BLD GAS, ICA,H+H)
Acid-Base Excess: 3 mmol/L — ABNORMAL HIGH (ref 0.0–2.0)
Bicarbonate: 24.7 mmol/L (ref 20.0–28.0)
Calcium, Ion: 1.18 mmol/L (ref 1.15–1.40)
HCT: 38 % — ABNORMAL LOW (ref 39.0–52.0)
Hemoglobin: 12.9 g/dL — ABNORMAL LOW (ref 13.0–17.0)
O2 Saturation: 94 %
Patient temperature: 97.7
Potassium: 3.3 mmol/L — ABNORMAL LOW (ref 3.5–5.1)
Sodium: 142 mmol/L (ref 135–145)
TCO2: 26 mmol/L (ref 22–32)
pCO2 arterial: 27.8 mmHg — ABNORMAL LOW (ref 32.0–48.0)
pH, Arterial: 7.555 — ABNORMAL HIGH (ref 7.350–7.450)
pO2, Arterial: 58 mmHg — ABNORMAL LOW (ref 83.0–108.0)

## 2018-06-21 LAB — COMPREHENSIVE METABOLIC PANEL
ALT: 28 U/L (ref 0–44)
AST: 57 U/L — ABNORMAL HIGH (ref 15–41)
Albumin: 3.7 g/dL (ref 3.5–5.0)
Alkaline Phosphatase: 56 U/L (ref 38–126)
Anion gap: 15 (ref 5–15)
BUN: 23 mg/dL (ref 8–23)
CO2: 25 mmol/L (ref 22–32)
Calcium: 9.2 mg/dL (ref 8.9–10.3)
Chloride: 105 mmol/L (ref 98–111)
Creatinine, Ser: 0.97 mg/dL (ref 0.61–1.24)
GFR calc Af Amer: >60 mL/min (ref >60)
GFR calc non Af Amer: >60 mL/min (ref >60)
Glucose, Bld: 155 mg/dL — ABNORMAL HIGH (ref 70–99)
Potassium: 4.2 mmol/L (ref 3.5–5.1)
Sodium: 145 mmol/L (ref 135–145)
Total Bilirubin: 0.4 mg/dL (ref 0.3–1.2)
Total Protein: 7.4 g/dL (ref 6.5–8.1)

## 2018-06-21 LAB — FERRITIN: Ferritin: 990 ng/mL — ABNORMAL HIGH (ref 24–336)

## 2018-06-21 LAB — C-REACTIVE PROTEIN: CRP: 2.5 mg/dL — ABNORMAL HIGH (ref <1.0)

## 2018-06-21 LAB — D-DIMER, QUANTITATIVE: D-Dimer, Quant: 0.62 ug/mL-FEU — ABNORMAL HIGH (ref 0.00–0.50)

## 2018-06-21 LAB — MAGNESIUM: Magnesium: 2.3 mg/dL (ref 1.7–2.4)

## 2018-06-21 MED ORDER — IOHEXOL 350 MG/ML SOLN
100.0000 mL | Freq: Once | INTRAVENOUS | Status: AC | PRN
  Administered 2018-06-21: 100 mL via INTRAVENOUS

## 2018-06-21 MED ORDER — METHYLPREDNISOLONE SODIUM SUCC 40 MG IJ SOLR
40.0000 mg | Freq: Two times a day (BID) | INTRAMUSCULAR | Status: DC
  Administered 2018-06-21 – 2018-06-22 (×2): 40 mg via INTRAVENOUS
  Filled 2018-06-21 (×2): qty 1

## 2018-06-21 MED ORDER — METHYLPREDNISOLONE SODIUM SUCC 40 MG IJ SOLR
40.0000 mg | Freq: Two times a day (BID) | INTRAMUSCULAR | Status: AC
  Administered 2018-06-21: 40 mg via INTRAVENOUS
  Filled 2018-06-21: qty 1

## 2018-06-21 NOTE — Progress Notes (Signed)
Patient pulling at IV and won't keep pulse ox or oxygen on causing oxygen to destat in the 57s  Spoke with Dr Onalee Hua restraints ordered as well as sitter when becomes available

## 2018-06-21 NOTE — Consult Note (Signed)
NAME:  [name] PerchesKeith [name], MRN:  [id number], DOB:  1955-11-17, LOS: 4 ADMISSION DATE:  06/17/2018, CONSULTATION DATE:  06/21/2018 REFERRING MD:  Dr Kerry HoughMemon, CHIEF COMPLAINT:  Acute hypoxic respiratory failure   Brief History     History of present illness   63 history of alcohol and polysubstance abuse, depression, hypertension, hyperlipidemia. COVID-19 positive on 5/17. Admitted from his SNF 5/19 with progressive hypoxemic respiratory failure in the setting of bilateral pulmonary infiltrates.  Received Actemra, steroids, some intermittent diuresis.  Unfortunately he had progressive hypoxemia and increasing oxygen requirement. Remdesivir was started 5/22.  Moved to the ICU 5/23, at some risk for intubation.    CT chest 5/23 performed, my preliminary read shows bilateral groundglass infiltrates, no clear evidence for pulmonary embolism.  Final read is pending.  Past Medical History   Past Medical History:  Diagnosis Date  . Altered mental status    admission 05/19/2013  . Anxiety   . Depression   . [facility]   . Jaundice    "yellow jaundice; hospitalized"   Significant Hospital Events     Consults:    Procedures:    Significant Diagnostic Tests:  CT-PA 5/23 >>   Micro Data:    Antimicrobials:  remdesivir 5/22 >>    Interim history/subjective:  Pt states he is comfortable.   Objective   Blood pressure 111/71, pulse 89, temperature 97.7 F (36.5 C), temperature source Oral, resp. rate (!) 24, weight 85.5 kg, SpO2 90 %.        Intake/Output Summary (Last 24 hours) at 06/21/2018 1723 Last data filed at 06/20/2018 1900 Gross per 24 hour  Intake 222 ml  Output -  Net 222 ml   Filed Weights   06/21/18 0540  Weight: 85.5 kg    Examination: General: NAD on 1.00 mask  HENT: OP clear, PERRL Lungs: few scattered b insp crackles.  Cardiovascular: regular, no M, distant Abdomen: soft, obese, NT, + BS Extremities: no edema Neuro: awake, alert, answers  questions. Some confabulation about "interacting with the [employer]" earlier - this is a chronic problem per Dr Trecia RogersMomon who discussed w family. No focal deficits  Resolved Hospital Problem list   Metabolic encephalopathy   Assessment & Plan:  Acute hypoxic respiratory failure due to COVID-19 pneumonia Agree with closer monitoring in the ICU.  He is at risk for intubation mechanical ventilation both due to his hypoxemia and also some evolving agitation and encephalopathy that is getting in the way of his care. Intermittent prone positioning if he can tolerate Received Actemra, currently on corticosteroids. On remdesivir Low threshold to start empiric antibiotics if he continues to decline given the potential for superimposed bacterial pneumonia  Hypertension On home amlodipine  History of paranoia, alcohol abuse.  Has experienced intermittent agitation and confusion during this hospitalization On clonazepam, Zyprexa May require intermittent benzo, Haldol depending on progression   Best practice:  Diet: Heart healthy Pain/Anxiety/Delirium protocol (if indicated): n/a VAP protocol (if indicated): n/a DVT prophylaxis: enoxaparin GI prophylaxis: n/a Glucose control: cbg Mobility: BR Code Status: Full Family Communication: Dr Kerry HoughMemon spoke with family 5/23 Disposition: to ICU 5/23  Labs   CBC: Recent Labs  Lab 06/17/18 1327 06/17/18 2220 06/18/18 0310 06/19/18 0407 06/20/18 0742 06/21/18 0345 06/21/18 1149  WBC 7.5 9.5 8.1 16.5* 16.5* 16.0*  --   NEUTROABS 5.5  --  6.7 14.9* 14.2* 13.2*  --   HGB 15.6 14.0 14.6 14.1 13.7 14.8 12.9*  HCT 46.5 42.1 43.7 42.4  40.0 44.9 38.0*  MCV 87.1 88.3 87.9 88.5 86.8 88.4  --   PLT 187 192 193 232 290 325  --     Basic Metabolic Panel: Recent Labs  Lab 06/17/18 1327 06/17/18 2220 06/18/18 0310 06/19/18 0407 06/20/18 0742 06/21/18 0345 06/21/18 1149  NA 141  --  138 141 140 145 142  K 3.4*  --  4.0 3.4* 3.3* 4.2 3.3*  CL 102  --  105  102 105 105  --   CO2 28  --  20* 29 23 25   --   GLUCOSE 133*  --  120* 171* 174* 155*  --   BUN 20  --  22 25* 28* 23  --   CREATININE 1.14 1.04 1.12 0.95 0.94 0.97  --   CALCIUM 8.5*  --  8.1* 8.6* 8.5* 9.2  --   MG  --   --   --  2.1 2.1 2.3  --    GFR: Estimated Creatinine Clearance: 77.9 mL/min (by C-G formula based on SCr of 0.97 mg/dL). Recent Labs  Lab 06/17/18 1326  06/17/18 1512 06/17/18 2220 06/18/18 0310 06/19/18 0407 06/20/18 0742 06/21/18 0345  PROCALCITON  --   --   --  <0.10  --   --   --   --   WBC  --    < >  --  9.5 8.1 16.5* 16.5* 16.0*  LATICACIDVEN 1.3  --  0.8  --   --   --   --   --    < > = values in this interval not displayed.    Liver Function Tests: Recent Labs  Lab 06/17/18 1327 06/18/18 0310 06/19/18 0407 06/20/18 0742 06/21/18 0345  AST 38 42* 41 48* 57*  ALT 17 17 19 22 28   ALKPHOS 38 32* 38 45 56  BILITOT 0.7 1.2 0.4 0.3 0.4  PROT 7.6 6.8 7.0 6.7 7.4  ALBUMIN 3.7 3.1* 3.2* 3.2* 3.7   No results for input(s): LIPASE, AMYLASE in the last 168 hours. No results for input(s): AMMONIA in the last 168 hours.  ABG    Component Value Date/Time   PHART 7.555 (H) 06/21/2018 1149   PCO2ART 27.8 (L) 06/21/2018 1149   PO2ART 58.0 (L) 06/21/2018 1149   HCO3 24.7 06/21/2018 1149   TCO2 26 06/21/2018 1149   O2SAT 94.0 06/21/2018 1149     Coagulation Profile: No results for input(s): INR, PROTIME in the last 168 hours.  Cardiac Enzymes: Recent Labs  Lab 06/17/18 1512  CKTOTAL 69    HbA1C: Hgb A1c MFr Bld  Date/Time Value Ref Range Status  05/19/2013 11:17 AM 5.6 <5.7 % Final    Comment:    (NOTE)                                                                       According to the ADA Clinical Practice Recommendations for 2011, when HbA1c is used as a screening test:  >=6.5%   Diagnostic of Diabetes Mellitus           (if abnormal result is confirmed) 5.7-6.4%   Increased risk of developing Diabetes Mellitus  References:Diagnosis and Classification of Diabetes Mellitus,Diabetes Care,2011,34(Suppl 1):S62-S69 and Standards of Medical Care in  Diabetes - 2011,Diabetes Care,2011,34 (Suppl 1):S11-S61.    CBG: Recent Labs  Lab 06/18/18 0720  GLUCAP 111*    Review of Systems:   No complaints currently  Past Medical History  He,  has a past medical history of Altered mental status, Anxiety, Depression, H/O [facility] spotted fever, and Jaundice.   Surgical History    Past Surgical History:  Procedure Laterality Date  . TONSILLECTOMY       Social History   reports that he has been smoking cigarettes. He has a 15.00 pack-year smoking history. He has never used smokeless tobacco. He reports current alcohol use. He reports current drug use. Drugs: LSD, Cocaine, Other-see comments, and Marijuana.   Family History   His family history is not on file.   Allergies No Known Allergies   Home Medications  Prior to Admission medications   Medication Sig Start Date End Date Taking? Authorizing Provider  acetaminophen (TYLENOL) 500 MG tablet Take 500 mg by mouth every 6 (six) hours as needed.   Yes [provider]  atorvastatin (LIPITOR) 10 MG tablet Take 10 mg by mouth at bedtime.   Yes [provider]  clonazePAM (KLONOPIN) 0.5 MG tablet Take 1 tablet (0.5 mg total) by mouth 2 (two) times daily. 05/23/13  Yes Ghimire, Werner Lean, MD  levofloxacin (LEVAQUIN) 500 MG tablet Take 500 mg by mouth daily. Started 5/14 x 10 day course   Yes [provider]  Multiple Vitamin (MULTIVITAMIN WITH MINERALS) TABS tablet Take 1 tablet by mouth daily. 05/22/13  Yes Dellinger, Tora Kindred, PA-C  OLANZapine (ZYPREXA) 5 MG tablet Take 5 mg by mouth at bedtime.   Yes [provider]  thiamine 100 MG tablet Take 1 tablet (100 mg total) by mouth daily. 05/22/13  Yes Dellinger, Tora Kindred, PA-C  OLANZapine (ZYPREXA) 2.5 MG tablet Take 1 tablet (2.5 mg total) by mouth at  bedtime. 12/11/15 12/10/16  Emily Filbert, MD     Critical care time: 70     Levy Pupa, MD, PhD 06/21/2018, 6:15 PM [facility] [phone] or if no answer [phone]

## 2018-06-21 NOTE — Plan of Care (Signed)

## 2018-06-21 NOTE — Progress Notes (Signed)
   06/21/18 1454  What Happened  Was fall witnessed? No  Was patient injured? No  Patient found on floor  Found by Staff-comment  Stated prior activity other (comment) (lying in bed)  Follow Up  MD notified Dr. Kerry Hough  Time MD notified [phone]  Additional tests No  Adult Fall Risk Assessment  Risk Factor Category (scoring not indicated) Fall has occurred during this admission (document High fall risk)  Patient Fall Risk Level High fall risk  Adult Fall Risk Interventions  Required Bundle Interventions *See Row Information* High fall risk - low, moderate, and high requirements implemented  Additional Interventions Use of appropriate toileting equipment (bedpan, BSC, etc.);Reorient/diversional activities with confused patients  Screening for Fall Injury Risk (To be completed on HIGH fall risk patients) - Assessing Need for Low Bed  Risk For Fall Injury- Low Bed Criteria None identified - Continue screening  Screening for Fall Injury Risk (To be completed on HIGH fall risk patients who do not meet crieteria for Low Bed) - Assessing Need for Floor Mats Only  Risk For Fall Injury- Criteria for Floor Mats Confusion/dementia (+NuDESC, CIWA, TBI, etc.)  Will Implement Floor Mats Yes  Oxygen Therapy  SpO2 90 %  O2 Device Non-rebreather Mask  Pain Assessment  Pain Scale 0-10  Pain Score 0  Neurological  Neuro (WDL) X  Level of Consciousness Alert  Orientation Level Oriented to person;Disoriented to place;Disoriented to situation;Disoriented to time  Cognition Impulsive;Poor attention/concentration;Poor judgement;Poor safety awareness  Speech Clear  R Hand Grip Present  L Hand Grip Present  R Foot Dorsiflexion Present  L Foot Dorsiflexion Present  R Foot Plantar Flexion Present  L Foot Plantar Flexion Present  Neuro Symptoms Forgetful  Musculoskeletal  Musculoskeletal (WDL) X  Generalized Weakness Yes  Weight Bearing Restrictions No  Integumentary  Integumentary (WDL) X  Skin Color  Pale

## 2018-06-21 NOTE — Progress Notes (Signed)
PROGRESS NOTE                                                                                                                                                                                                             Patient Demographics:    [name] [name], is a 63 y.o. male, DOB - 1955-02-11, ZOX:[id number]  Outpatient Primary MD for the patient is Patient, No Pcp Per    LOS - 4  No chief complaint on file.      Brief Narrative: Patient is a 63 y.o. male with PMHx of alcohol abuse-presenting with acute hypoxic respiratory failure in the setting of COVID-19 viral pneumonia.   Subjective:    [name] [name] was agitated overnight and is currently in wrist restraints. He kept pulling off oxygen. Currently on NRB mask. Denies any shortness of breath or cough. No distress. Mildly confused.   Assessment  & Plan :   Acute Hypoxic Resp Failure due to Covid 19 Viral pneumonia: Still with significant oxygen requirements-no tangible improvement with Actemra x2, empiric Solu-Medrol-and 1 dose of Lasix.  Overnight he was desaturating on high flow oxygen and was placed on NRB mask.  Clinically, he does not appear to be in frank respiratory distress.  No signs of volume overload.  Although his inflammatory markers are slowly downtrending-he continues to have significant amount of hypoxia-Started remdesivir on 5/22.  Continue steroids.  Chest x-ray performed 5/23 indicates persistent patchy bilateral infiltrates.  ABG showed significant hypoxia with a PO2 of 58 on 100% oxygen.  pH is 7.55 with a low PCO2 indicating hyperventilation.  I will obtain CT chest to rule out underlying PE.  Discussed with Dr. Delton Coombes, will move the patient to ICU for closer monitoring.Marland Kitchen  COVID-19 Labs:  Recent Labs    06/19/18 0407 06/19/18 0412 06/20/18 0742 06/21/18 0345  DDIMER 1.26*  --  0.58* 0.62*  FERRITIN  --  886* 896*  --   CRP  --  11.4* 4.0* 2.5*    COVID-19 medications: 5/22>> RemdesIvir 5/21>> Actemra 5/20>> Solu-Medrol 5/19 >>Actemra  Acute metabolic encephalopathy: Resolved-completely awake and alert-suspect this could have been secondary to hypoxia.  He does have some paranoia, but this is his baseline  Hypertension: Controlled-continue with amlodipine  Dyslipidemia: Hold statins for now  History of paranoia/alcohol abuse: At SNF-and abstaining from alcohol-continue Zyprexa, Klonopin.  He has had some agitation overnight and is currently in wrist restraints. Does not appear agitated on my arrival, but is still mildly confused and asking to take NRB mask off, despite being told the importance of wearing mask.  The treatment plan,use of Actemra/Solu-Medrol and its known side effects were discussed with patient, it was clearly explained that there is no proven definitive treatment for COVID-19 infection, any medications used here are based on published clinical articles/anecdotal data which were not obtained by randomized controlled trials.  Complete risks and long-term side effects are unknown, however in the current scenario of ongoing patient deterioration,the clinical judgment is that the benefit may outweigh the risks. Patient agree's with the treatment plan and consent to receive the recommended medications.    ABG:    Component Value Date/Time   HCO3 27.2 06/17/2018 1327   O2SAT 48.8 06/17/2018 1327    Condition - Extremely Guarded  Family Communication  : Discussed with his sister who has been keeping in touch with nursing staff.  Code Status :  Full Code  Diet : heart healthy  Disposition Plan  :  Remain inpatient, transfer to ICU  Consults  :  None  Procedures  :   None  DVT Prophylaxis  :  Lovenox twice daily dosing given significant hypoxia  Lab Results  Component Value Date   PLT 325 06/21/2018    Inpatient Medications  Scheduled Meds: . amLODipine  5 mg Oral Daily  . aspirin EC  81 mg Oral Daily   . clonazePAM  0.5 mg Oral BID  . enoxaparin (LOVENOX) injection  40 mg Subcutaneous BID  . folic acid  1 mg Oral Daily  . methylPREDNISolone (SOLU-MEDROL) injection  40 mg Intravenous Q12H  . multivitamin with minerals  1 tablet Oral Daily  . OLANZapine  5 mg Oral QHS  . pantoprazole  40 mg Oral Daily  . sodium chloride flush  3 mL Intravenous Q12H  . thiamine  100 mg Oral Daily  . vitamin C  500 mg Oral Daily  . zinc sulfate  220 mg Oral Daily   Continuous Infusions: . sodium chloride Stopped (06/19/18 1600)  . remdesivir 100 mg in NS 250 mL     PRN Meds:.sodium chloride, acetaminophen, chlorpheniramine-HYDROcodone, guaiFENesin-dextromethorphan, LORazepam, ondansetron **OR** ondansetron (ZOFRAN) IV, sodium chloride flush  Antibiotics  :    Anti-infectives (From admission, onward)   Start     Dose/Rate Route Frequency Ordered Stop   06/21/18 1400  remdesivir 100 mg in sodium chloride 0.9 % 230 mL IVPB     100 mg over 30 Minutes Intravenous Every 24 hours 06/20/18 1234 06/25/18 1359   06/20/18 1400  remdesivir 200 mg in sodium chloride 0.9 % 210 mL IVPB     200 mg over 30 Minutes Intravenous Once 06/20/18 1234 06/20/18 1531       Time Spent in minutes  35    Erick Blinks M.D on 06/21/2018 at 8:51 AM  To page go to [email or web address] - use universal password  [facility] -  Office  [phone]  See all Orders from today for further details   Admit date - 06/17/2018    4    Objective:   Vitals:   06/20/18 0900 06/20/18 1230 06/20/18 2105 06/21/18 0540  BP: 96/72 (!) 143/95    Pulse: 70 89  72  Resp:  18 20 (!) 24  Temp: 97.9 F (36.6 C) 97.6 F (36.4 C) 98.7 F (37.1 C) 97.7 F (36.5 C)  TempSrc: Axillary Axillary Oral Oral  SpO2: 93% 94% (!) 89% (!) 88%  Weight:    85.5 kg    Wt Readings from Last 3 Encounters:  06/21/18 85.5 kg  06/17/18 81.6 kg  12/11/15 81.6 kg     Intake/Output Summary (Last 24 hours) at 06/21/2018 0851 Last data filed at  06/20/2018 1900 Gross per 24 hour  Intake 632 ml  Output -  Net 632 ml     Physical Exam Gen Exam: Awake/alert-on NRB.  No signs of acute distress.  Speaking in full sentences. HEENT: Atraumatic, normocephalic. Chest: clear bilaterally  CVS: S1-S2 regular-no murmurs.   Abdomen: Soft, nontender nondistended. Extremities: No edema.   Neurology: Nonfocal  skin: no rash   Data Review:    CBC Recent Labs  Lab 06/17/18 1327 06/17/18 2220 06/18/18 0310 06/19/18 0407 06/20/18 0742 06/21/18 0345  WBC 7.5 9.5 8.1 16.5* 16.5* 16.0*  HGB 15.6 14.0 14.6 14.1 13.7 14.8  HCT 46.5 42.1 43.7 42.4 40.0 44.9  PLT 187 192 193 232 290 325  MCV 87.1 88.3 87.9 88.5 86.8 88.4  MCH 29.2 29.4 29.4 29.4 29.7 29.1  MCHC 33.5 33.3 33.4 33.3 34.3 33.0  RDW 13.8 13.9 13.9 13.9 13.6 13.8  LYMPHSABS 1.5  --  1.0 1.0 1.4 1.6  MONOABS 0.6  --  0.4 0.5 0.5 0.9  EOSABS 0.0  --  0.0 0.0 0.0 0.0  BASOSABS 0.0  --  0.0 0.0 0.0 0.1    Chemistries  Recent Labs  Lab 06/17/18 1327 06/17/18 2220 06/18/18 0310 06/19/18 0407 06/20/18 0742 06/21/18 0345  NA 141  --  138 141 140 145  K 3.4*  --  4.0 3.4* 3.3* 4.2  CL 102  --  105 102 105 105  CO2 28  --  20* GLUCOSE 133*  --  120* 171* 174* 155*  BUN 20  --  22 25* 28* 23  CREATININE 1.14 1.04 1.12 0.95 0.94 0.97  CALCIUM 8.5*  --  8.1* 8.6* 8.5* 9.2  MG  --   --   --  2.1 2.1 2.3  AST 38  --  42* 41 48* 57*  ALT 17  --  ALKPHOS 38  --  32* 38 45 56  BILITOT 0.7  --  1.2 0.4 0.3 0.4   ------------------------------------------------------------------------------------------------------------------ No results for input(s): CHOL, HDL, LDLCALC, TRIG, CHOLHDL, LDLDIRECT in the last 72 hours.  Lab Results  Component Value Date   HGBA1C 5.6 05/19/2013   ------------------------------------------------------------------------------------------------------------------ No results for input(s): TSH, T4TOTAL, T3FREE, THYROIDAB in  the last 72 hours.  Invalid input(s): FREET3 ------------------------------------------------------------------------------------------------------------------ Recent Labs    06/19/18 0412 06/20/18 0742  FERRITIN 886* 896*    Coagulation profile No results for input(s): INR, PROTIME in the last 168 hours.  Recent Labs    06/20/18 0742 06/21/18 0345  DDIMER 0.58* 0.62*    Cardiac Enzymes No results for input(s): CKMB, TROPONINI, MYOGLOBIN in the last 168 hours.  Invalid input(s): CK ------------------------------------------------------------------------------------------------------------------    Component Value Date/Time   BNP 82.6 06/19/2018 0407    Micro Results Recent Results (from the past 240 hour(s))  Blood Culture (routine x 2)     Status: None (Preliminary result)   Collection Time: 06/17/18  1:27 PM  Result Value Ref Range Status   Specimen Description BLOOD BLOOD LEFT ARM  Final   Special Requests   Final    BOTTLES DRAWN AEROBIC AND ANAEROBIC Blood  Culture adequate volume   Culture   Final    NO GROWTH 3 DAYS Performed at [facility], [address] [place], [place] [address]    Report Status PENDING  Incomplete  Blood Culture (routine x 2)     Status: None (Preliminary result)   Collection Time: 06/17/18  1:28 PM  Result Value Ref Range Status   Specimen Description BLOOD LEFT ANTECUBITAL  Final   Special Requests   Final    BOTTLES DRAWN AEROBIC AND ANAEROBIC Blood Culture adequate volume   Culture   Final    NO GROWTH 3 DAYS Performed at [facility], [address] [place], [place] [address]    Report Status PENDING  Incomplete    Radiology Reports Dg Chest Port 1 View  Result Date: 06/17/2018 CLINICAL DATA:  Fever EXAM: PORTABLE CHEST 1 VIEW COMPARISON:  May 19, 2013 FINDINGS: There are foci of airspace consolidation in each upper and lower lobe region. There is a small left pleural effusion. Heart size and  pulmonary vascularity are normal. No adenopathy. No bone lesions. IMPRESSION: Multifocal pneumonia. Small left pleural effusion. No adenopathy evident. Electronically Signed   By: Bretta BangWilliam  Woodruff III M.D.   On: 06/17/2018 14:13   Dg Chest Port 1v Same Day  Result Date: 06/19/2018 CLINICAL DATA:  Shortness of breath. EXAM: PORTABLE CHEST 1 VIEW COMPARISON:  Radiograph of Jun 17, 2018. FINDINGS: The heart size and mediastinal contours are within normal limits. No pneumothorax or pleural effusion is noted. Mildly decreased bilateral lung opacities are noted suggesting improving multifocal pneumonia. The visualized skeletal structures are unremarkable. IMPRESSION: Mildly decreased bilateral lung opacities are noted suggesting improving multifocal pneumonia. Electronically Signed   By: Lupita RaiderJames  Green Jr M.D.   On: 06/19/2018 09:14

## 2018-06-21 NOTE — Progress Notes (Signed)
CardioVascular Research Department and AHF Team  ReDS Research Project   Patient #: [id number]  ReDS Measurement  Right: 42 %  Left: 55 %

## 2018-06-21 NOTE — Progress Notes (Signed)
Patient transferred to ICU via bed. No respiratory distress. 25L with NRB portable oxygen required. Report given to ICU nurse.

## 2018-06-22 LAB — CBC WITH DIFFERENTIAL/PLATELET
Abs Immature Granulocytes: 0.44 10*3/uL — ABNORMAL HIGH (ref 0.00–0.07)
Basophils Absolute: 0.1 10*3/uL (ref 0.0–0.1)
Basophils Relative: 1 %
Eosinophils Absolute: 0 10*3/uL (ref 0.0–0.5)
Eosinophils Relative: 0 %
HCT: 41.7 % (ref 39.0–52.0)
Hemoglobin: 14.2 g/dL (ref 13.0–17.0)
Immature Granulocytes: 3 %
Lymphocytes Relative: 11 %
Lymphs Abs: 1.6 10*3/uL (ref 0.7–4.0)
MCH: 29.7 pg (ref 26.0–34.0)
MCHC: 34.1 g/dL (ref 30.0–36.0)
MCV: 87.2 fL (ref 80.0–100.0)
Monocytes Absolute: 0.9 10*3/uL (ref 0.1–1.0)
Monocytes Relative: 6 %
Neutro Abs: 11.8 10*3/uL — ABNORMAL HIGH (ref 1.7–7.7)
Neutrophils Relative %: 79 %
Platelets: 349 10*3/uL (ref 150–400)
RBC: 4.78 MIL/uL (ref 4.22–5.81)
RDW: 14.1 % (ref 11.5–15.5)
WBC: 14.8 10*3/uL — ABNORMAL HIGH (ref 4.0–10.5)
nRBC: 0 % (ref 0.0–0.2)

## 2018-06-22 LAB — COMPREHENSIVE METABOLIC PANEL
ALT: 32 U/L (ref 0–44)
AST: 49 U/L — ABNORMAL HIGH (ref 15–41)
Albumin: 3.4 g/dL — ABNORMAL LOW (ref 3.5–5.0)
Alkaline Phosphatase: 45 U/L (ref 38–126)
Anion gap: 10 (ref 5–15)
BUN: 20 mg/dL (ref 8–23)
CO2: 27 mmol/L (ref 22–32)
Calcium: 8.8 mg/dL — ABNORMAL LOW (ref 8.9–10.3)
Chloride: 106 mmol/L (ref 98–111)
Creatinine, Ser: 0.79 mg/dL (ref 0.61–1.24)
GFR calc Af Amer: >60 mL/min (ref >60)
GFR calc non Af Amer: >60 mL/min (ref >60)
Glucose, Bld: 144 mg/dL — ABNORMAL HIGH (ref 70–99)
Potassium: 3.5 mmol/L (ref 3.5–5.1)
Sodium: 143 mmol/L (ref 135–145)
Total Bilirubin: 0.3 mg/dL (ref 0.3–1.2)
Total Protein: 6.7 g/dL (ref 6.5–8.1)

## 2018-06-22 LAB — C-REACTIVE PROTEIN: CRP: 1.4 mg/dL — ABNORMAL HIGH (ref <1.0)

## 2018-06-22 LAB — MAGNESIUM: Magnesium: 2.2 mg/dL (ref 1.7–2.4)

## 2018-06-22 LAB — D-DIMER, QUANTITATIVE: D-Dimer, Quant: 0.54 ug/mL-FEU — ABNORMAL HIGH (ref 0.00–0.50)

## 2018-06-22 LAB — CULTURE, BLOOD (ROUTINE X 2)
Culture: NO GROWTH
Culture: NO GROWTH
Special Requests: ADEQUATE
Special Requests: ADEQUATE

## 2018-06-22 LAB — FERRITIN: Ferritin: 1032 ng/mL — ABNORMAL HIGH (ref 24–336)

## 2018-06-22 MED ORDER — HALOPERIDOL LACTATE 5 MG/ML IJ SOLN
2.0000 mg | Freq: Four times a day (QID) | INTRAMUSCULAR | Status: DC | PRN
  Administered 2018-06-22 – 2018-06-24 (×2): 5 mg via INTRAVENOUS
  Filled 2018-06-22 (×2): qty 1

## 2018-06-22 MED ORDER — LORAZEPAM 2 MG/ML IJ SOLN
1.0000 mg | INTRAMUSCULAR | Status: DC | PRN
  Administered 2018-06-24 – 2018-06-25 (×3): 2 mg via INTRAVENOUS
  Filled 2018-06-22 (×3): qty 1

## 2018-06-22 MED ORDER — DEXMEDETOMIDINE HCL IN NACL 400 MCG/100ML IV SOLN
0.4000 ug/kg/h | INTRAVENOUS | Status: DC
  Administered 2018-06-22: 0.8 ug/kg/h via INTRAVENOUS
  Filled 2018-06-22: qty 100

## 2018-06-22 MED ORDER — OLANZAPINE 5 MG PO TABS
7.5000 mg | ORAL_TABLET | Freq: Every day | ORAL | Status: DC
  Filled 2018-06-22: qty 1

## 2018-06-22 MED ORDER — LORAZEPAM 2 MG/ML IJ SOLN
INTRAMUSCULAR | Status: AC
  Administered 2018-06-22: 2 mg via INTRAVENOUS
  Filled 2018-06-22: qty 1

## 2018-06-22 MED ORDER — LORAZEPAM 2 MG/ML IJ SOLN
2.0000 mg | Freq: Once | INTRAMUSCULAR | Status: AC
  Administered 2018-06-22: 2 mg via INTRAVENOUS

## 2018-06-22 MED ORDER — FUROSEMIDE 10 MG/ML IJ SOLN
40.0000 mg | Freq: Two times a day (BID) | INTRAMUSCULAR | Status: AC
  Administered 2018-06-22 – 2018-06-23 (×2): 40 mg via INTRAVENOUS
  Filled 2018-06-22 (×2): qty 4

## 2018-06-22 MED ORDER — OLANZAPINE 5 MG PO TABS
7.5000 mg | ORAL_TABLET | Freq: Every day | ORAL | Status: DC
  Administered 2018-06-23 – 2018-06-27 (×6): 7.5 mg via ORAL
  Filled 2018-06-22 (×6): qty 1.5

## 2018-06-22 NOTE — Progress Notes (Signed)
NAME:  [name] [name], MRN:  [id number], DOB:  1956-01-05, LOS: 5 ADMISSION DATE:  06/17/2018, CONSULTATION DATE:  06/21/2018 REFERRING MD:  Dr Kerry Hough, CHIEF COMPLAINT:  Acute hypoxic respiratory failure   Brief History     History of present illness   63 history of alcohol and polysubstance abuse, depression, hypertension, hyperlipidemia. COVID-19 positive on 5/17. Admitted from his SNF 5/19 with progressive hypoxemic respiratory failure in the setting of bilateral pulmonary infiltrates.  Received Actemra, steroids, some intermittent diuresis.  Unfortunately he had progressive hypoxemia and increasing oxygen requirement. Remdesivir was started 5/22.  Moved to the ICU 5/23, at some risk for intubation.    CT chest 5/23 performed, my preliminary read shows bilateral groundglass infiltrates, no clear evidence for pulmonary embolism.  Final read is pending.  Past Medical History   Past Medical History:  Diagnosis Date  . Altered mental status    admission 05/19/2013  . Anxiety   . Depression   . H/O [facility] spotted fever   . Jaundice    "yellow jaundice; hospitalized"   Significant Hospital Events     Consults:    Procedures:    Significant Diagnostic Tests:  CT-PA 5/23 >>   Micro Data:    Antimicrobials:  remdesivir 5/22 >>    Interim history/subjective:  Has been impulsive, removes o2 at times > assoc w desats Soft restraints in place Didn't self-prone very reliably last night Ferritin 1032, CRP 1.4 Net 650cc positive  Objective   Blood pressure 133/80, pulse (!) 101, temperature 98.4 F (36.9 C), temperature source Oral, resp. rate (!) 29, weight 85.5 kg, SpO2 (!) 84 %.    FiO2 (%):  [100 %] 100 %   Intake/Output Summary (Last 24 hours) at 06/22/2018 1206 Last data filed at 06/22/2018 0800 Gross per 24 hour  Intake 360 ml  Output 700 ml  Net -340 ml   Filed Weights   06/21/18 0540  Weight: 85.5 kg    Examination: General: no distress, slight  agitation, 1.00 mask in place HENT: Op clear, pupils equal Lungs: B insp crackles, no wheezes Cardiovascular: distant, regular, no m Abdomen: soft, obese, + BS Extremities: no edema Neuro: awake, a bit less oriented than 5/23, pulls mask off despite redirection. Thought he was in [place], knew this is a hospital. Has confabulated intermittently (apparently his baseline)  Resolved Hospital Problem list   Metabolic encephalopathy   Assessment & Plan:  Acute hypoxic respiratory failure due to COVID-19 pneumonia Continue ICU monitoring given his high oxygen needs and intermittent desaturations.  His acute on chronic neurological needs also require closer monitoring. Continue intermittent pronating if he will cooperate Received Actemra, currently on corticosteroids.  Consider shortening the duration of steroids given his mental status issues On remdesivir No fever, initial procalcitonin negative.  Hold off on starting antibiotics for now. Follow his inflammatory markers, chest x-ray  Hypertension Continue home amlodipine  History of paranoia, alcohol abuse.  Has experienced intermittent agitation and confusion during this hospitalization Zyprexa, clonazepam May require intermittent benzos, Haldol depending on progression Would like to get him out of soft restraints if possible   Best practice:  Diet: Heart healthy Pain/Anxiety/Delirium protocol (if indicated): n/a VAP protocol (if indicated): n/a DVT prophylaxis: enoxaparin GI prophylaxis: n/a Glucose control: cbg Mobility: BR Code Status: Full Family Communication: left message for sister Meriam Sprague 5/24, will try her again.  Disposition: ICU  Labs   CBC: Recent Labs  Lab 06/18/18 0310 06/19/18 0407 06/20/18 7253 06/21/18 0345 06/21/18 1149  06/22/18 0505  WBC 8.1 16.5* 16.5* 16.0*  --  14.8*  NEUTROABS 6.7 14.9* 14.2* 13.2*  --  11.8*  HGB 14.6 14.1 13.7 14.8 12.9* 14.2  HCT 43.7 42.4 40.0 44.9 38.0* 41.7  MCV 87.9  88.5 86.8 88.4  --  87.2  PLT 193 232 290 325  --  349    Basic Metabolic Panel: Recent Labs  Lab 06/18/18 0310 06/19/18 0407 06/20/18 0742 06/21/18 0345 06/21/18 1149 06/22/18 0505  NA 138 141 140 145 142 143  K 4.0 3.4* 3.3* 4.2 3.3* 3.5  CL 105 102 105 105  --  106  CO2 20* 29 23 25   --  27  GLUCOSE 120* 171* 174* 155*  --  144*  BUN 22 25* 28* 23  --  20  CREATININE 1.12 0.95 0.94 0.97  --  0.79  CALCIUM 8.1* 8.6* 8.5* 9.2  --  8.8*  MG  --  2.1 2.1 2.3  --  2.2   GFR: Estimated Creatinine Clearance: 94.4 mL/min (by C-G formula based on SCr of 0.79 mg/dL). Recent Labs  Lab 06/17/18 1326  06/17/18 1512 06/17/18 2220  06/19/18 0407 06/20/18 0742 06/21/18 0345 06/22/18 0505  PROCALCITON  --   --   --  <0.10  --   --   --   --   --   WBC  --    < >  --  9.5   < > 16.5* 16.5* 16.0* 14.8*  LATICACIDVEN 1.3  --  0.8  --   --   --   --   --   --    < > = values in this interval not displayed.    Liver Function Tests: Recent Labs  Lab 06/18/18 0310 06/19/18 0407 06/20/18 0742 06/21/18 0345 06/22/18 0505  AST 42* 41 48* 57* 49*  ALT 17 19 22 28  32  ALKPHOS 32* 38 45 56 45  BILITOT 1.2 0.4 0.3 0.4 0.3  PROT 6.8 7.0 6.7 7.4 6.7  ALBUMIN 3.1* 3.2* 3.2* 3.7 3.4*   No results for input(s): LIPASE, AMYLASE in the last 168 hours. No results for input(s): AMMONIA in the last 168 hours.  ABG    Component Value Date/Time   PHART 7.555 (H) 06/21/2018 1149   PCO2ART 27.8 (L) 06/21/2018 1149   PO2ART 58.0 (L) 06/21/2018 1149   HCO3 24.7 06/21/2018 1149   TCO2 26 06/21/2018 1149   O2SAT 94.0 06/21/2018 1149     Coagulation Profile: No results for input(s): INR, PROTIME in the last 168 hours.  Cardiac Enzymes: Recent Labs  Lab 06/17/18 1512  CKTOTAL 69    HbA1C: Hgb A1c MFr Bld  Date/Time Value Ref Range Status  05/19/2013 11:17 AM 5.6 <5.7 % Final    Comment:    (NOTE)                                                                       According to  the ADA Clinical Practice Recommendations for 2011, when HbA1c is used as a screening test:  >=6.5%   Diagnostic of Diabetes Mellitus           (if abnormal result is confirmed) 5.7-6.4%   Increased risk of developing Diabetes  [place] References:Diagnosis and Classification of Diabetes Mellitus,Diabetes Care,2011,34(Suppl 1):S62-S69 and Standards of Medical Care in         Diabetes - 2011,Diabetes Care,2011,34 (Suppl 1):S11-S61.    CBG: Recent Labs  Lab 06/18/18 0720  GLUCAP 111*     Critical care time: 7331     Levy Pupaobert Sharri Loya, MD, PhD 06/22/2018, 12:06 PM [facility] [phone] or if no answer [phone]

## 2018-06-22 NOTE — Progress Notes (Signed)
Pt still combative, called dr byrum and initiated precedex. Pts sister returned call and updated her. 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 Pt has been living in nursing home since. [name] [name].rn

## 2018-06-22 NOTE — Progress Notes (Signed)
Patient able to reach 1500 mL using the incentive spirometer.

## 2018-06-22 NOTE — Progress Notes (Signed)
Sister called and updated.

## 2018-06-22 NOTE — Progress Notes (Signed)
eLink Physician-Brief Progress Note Patient Name: [name] [name] DOB: April 03, 1955 MRN: [id number]   Date of Service  06/22/2018  HPI/Events of Note  Renewal restraints  eICU Interventions  Renewed for prevent self harm and injury.     Intervention Category Minor Interventions: Agitation / anxiety - evaluation and management  Ranee Gosselin 06/22/2018, 6:19 AM

## 2018-06-22 NOTE — Progress Notes (Signed)
Called patients sister at 1030,no answer.left message. No return call yet.Melodye Ped

## 2018-06-22 NOTE — Progress Notes (Signed)
[place] TEAM 1 - Stepdown/ICU TEAM  [name] PerchesKeith Mixson  ZOX:[id number] DOB: 06-09-1955 DOA: 06/17/2018 PCP: [name] [name], No Pcp Per    Brief Narrative:  63yo w/ a history of alcohol and polysubstance abuse, depression, HTN, HLD, and COVID-19 positive on 5/17 who was admitted from his SNF 5/19 with progressive hypoxemic respiratory failure in the setting of bilateral pulmonary infiltrates.  He received Actemra, steroids, and some intermittent diuresis, despite which he had progressive hypoxemia and increasing oxygen requirement. Remdesivir was started 5/22. Moved to the ICU 5/23, at risk for intubation.    Significant Events: 5/19 admit from SNF 5/23 transfer to ICU - CTa w/o PE   COVID-19 specific Treatment: Actemra 5/19 & 5/21 Steroids 5/20 > 5/24 Remdesivir 5/22 >   Ventilator Settings:  NA  Subjective: Pt is awake but confused. He can not provide a reliable hx. He is not able to tell me where he is. He appears to be in mild resp distress, but does not actually appear uncomfortable despite this. Has proven to be combative at times, pulling off his O2 and fighting against nursing interventions - making proning attempts very difficult.   Assessment & Plan:  Acute hypoxic respiratory failure due to COVID-19 pneumonia at risk for intubation - continues to require high level O2 support - keep in ICU   Recent Labs    06/20/18 0742 06/21/18 0345 06/22/18 0505  DDIMER 0.58* 0.62* 0.54*  FERRITIN 896* 990* 1,032*  CRP 4.0* 2.5* 1.4*    Acute metabolic encephalopathy Multifactorial - hx of paranoia/psych hx - acute delirium - steroids - stop steroids today - add haldol prn to assist in cooperation w/ care   Hypertension On home amlodipine - BP controlled today   History of paranoia and polysubstance abuse Cont clonazepam, Zyprexa  HLD  DVT prophylaxis: lovenox  Code Status: FULL CODE Family Communication: RN placed call to family which was not answered   Disposition Plan:    Consultants:  PCCM  Antimicrobials:  none  Objective: Blood pressure (!) 109/91, pulse (!) 102, temperature 98.4 F (36.9 C), temperature source Oral, resp. rate (!) 37, weight 85.5 kg, SpO2 (!) 88 %.  Intake/Output Summary (Last 24 hours) at 06/22/2018 1700 Last data filed at 06/22/2018 1512 Gross per 24 hour  Intake 840 ml  Output 800 ml  Net 40 ml   Filed Weights   06/21/18 0540  Weight: 85.5 kg    Examination: General: modest resp distress Lungs: diffuse crackles - no wheezing  Cardiovascular: RRR - no M or rub  Abdomen: NT/ND, soft, bs+, no mass  Extremities: trace edema B LE   CBC: Recent Labs  Lab 06/20/18 0742 06/21/18 0345 06/21/18 1149 06/22/18 0505  WBC 16.5* 16.0*  --  14.8*  NEUTROABS 14.2* 13.2*  --  11.8*  HGB 13.7 14.8 12.9* 14.2  HCT 40.0 44.9 38.0* 41.7  MCV 86.8 88.4  --  87.2  PLT 290 325  --  349   Basic Metabolic Panel: Recent Labs  Lab 06/20/18 0742 06/21/18 0345 06/21/18 1149 06/22/18 0505  NA 140 145 142 143  K 3.3* 4.2 3.3* 3.5  CL 105 105  --  106  CO2 23 25  --  27  GLUCOSE 174* 155*  --  144*  BUN 28* 23  --  20  CREATININE 0.94 0.97  --  0.79  CALCIUM 8.5* 9.2  --  8.8*  MG 2.1 2.3  --  2.2   GFR: Estimated Creatinine Clearance: 94.4 mL/min (  by C-G formula based on SCr of 0.79 mg/dL).  Liver Function Tests: Recent Labs  Lab 06/19/18 0407 06/20/18 0742 06/21/18 0345 06/22/18 0505  AST 41 48* 57* 49*  ALT 19 22 28  32  ALKPHOS 38 45 56 45  BILITOT 0.4 0.3 0.4 0.3  PROT 7.0 6.7 7.4 6.7  ALBUMIN 3.2* 3.2* 3.7 3.4*    Cardiac Enzymes: Recent Labs  Lab 06/17/18 1512  CKTOTAL 69    HbA1C: Hgb A1c MFr Bld  Date/Time Value Ref Range Status  05/19/2013 11:17 AM 5.6 <5.7 % Final    Comment:    (NOTE)                                                                       According to the ADA Clinical Practice Recommendations for 2011, when HbA1c is used as a screening test:  >=6.5%   Diagnostic of Diabetes  [place]           (if abnormal result is confirmed) 5.7-6.4%   Increased risk of developing Diabetes Mellitus References:Diagnosis and Classification of Diabetes Mellitus,Diabetes Care,2011,34(Suppl 1):S62-S69 and Standards of Medical Care in         Diabetes - 2011,Diabetes Care,2011,34 (Suppl 1):S11-S61.    CBG: Recent Labs  Lab 06/18/18 0720  GLUCAP 111*    Recent Results (from the past 240 hour(s))  Blood Culture (routine x 2)     Status: None   Collection Time: 06/17/18  1:27 PM  Result Value Ref Range Status   Specimen Description BLOOD BLOOD LEFT ARM  Final   Special Requests   Final    BOTTLES DRAWN AEROBIC AND ANAEROBIC Blood Culture adequate volume   Culture   Final    NO GROWTH 5 DAYS Performed at [facility], [address], [place], [place] [address]    Report Status 06/22/2018 FINAL  Final  Blood Culture (routine x 2)     Status: None   Collection Time: 06/17/18  1:28 PM  Result Value Ref Range Status   Specimen Description BLOOD LEFT ANTECUBITAL  Final   Special Requests   Final    BOTTLES DRAWN AEROBIC AND ANAEROBIC Blood Culture adequate volume   Culture   Final    NO GROWTH 5 DAYS Performed at [facility], [address], [place], [place] [address]    Report Status 06/22/2018 FINAL  Final     Scheduled Meds: . amLODipine  5 mg Oral Daily  . aspirin EC  81 mg Oral Daily  . clonazePAM  0.5 mg Oral BID  . enoxaparin (LOVENOX) injection  40 mg Subcutaneous BID  . folic acid  1 mg Oral Daily  . furosemide  40 mg Intravenous Q12H  . methylPREDNISolone (SOLU-MEDROL) injection  40 mg Intravenous Q12H  . multivitamin with minerals  1 tablet Oral Daily  . OLANZapine  5 mg Oral QHS  . pantoprazole  40 mg Oral Daily  . sodium chloride flush  3 mL Intravenous Q12H  . thiamine  100 mg Oral Daily  . vitamin C  500 mg Oral Daily  . zinc sulfate  220 mg Oral Daily     LOS: 5 days   Lonia Blood, MD [facility] Office  [phone] Pager - Text Page per Loretha Stapler  If 7PM-7AM, please contact night-coverage per Amion 06/22/2018, 5:00 PM

## 2018-06-22 NOTE — Progress Notes (Addendum)
Pt has been restless all day. Increasingly agitated despite 1:1 with rn, frequent offers of fluid,distraction by talkin.Pt t hs been pulling off his oxygen all day even in restraints. . Paged md and received orders for haldol which was ineffective. Pt then became belligerent, pulling off mits and restraints with his teeth, throwing his legs off bed, attempting to strike staff, verbally threatening to shoot [place] with an AK 47. md then ordered 2mg  ativan,given at 1725, no response to medication yet. Continue to monitor.

## 2018-06-23 ENCOUNTER — Encounter (HOSPITAL_COMMUNITY): Payer: Self-pay | Admitting: Nurse Practitioner

## 2018-06-23 ENCOUNTER — Inpatient Hospital Stay (HOSPITAL_COMMUNITY): Payer: Medicare Other

## 2018-06-23 DIAGNOSIS — J189 Pneumonia, unspecified organism: Secondary | ICD-10-CM

## 2018-06-23 DIAGNOSIS — G9349 Other encephalopathy: Secondary | ICD-10-CM

## 2018-06-23 LAB — COMPREHENSIVE METABOLIC PANEL
ALT: 59 U/L — ABNORMAL HIGH (ref 0–44)
AST: 82 U/L — ABNORMAL HIGH (ref 15–41)
Albumin: 3.1 g/dL — ABNORMAL LOW (ref 3.5–5.0)
Alkaline Phosphatase: 38 U/L (ref 38–126)
Anion gap: 11 (ref 5–15)
BUN: 29 mg/dL — ABNORMAL HIGH (ref 8–23)
CO2: 27 mmol/L (ref 22–32)
Calcium: 8.5 mg/dL — ABNORMAL LOW (ref 8.9–10.3)
Chloride: 108 mmol/L (ref 98–111)
Creatinine, Ser: 0.94 mg/dL (ref 0.61–1.24)
GFR calc Af Amer: >60 mL/min (ref >60)
GFR calc non Af Amer: >60 mL/min (ref >60)
Glucose, Bld: 118 mg/dL — ABNORMAL HIGH (ref 70–99)
Potassium: 3.5 mmol/L (ref 3.5–5.1)
Sodium: 146 mmol/L — ABNORMAL HIGH (ref 135–145)
Total Bilirubin: 0.7 mg/dL (ref 0.3–1.2)
Total Protein: 6.2 g/dL — ABNORMAL LOW (ref 6.5–8.1)

## 2018-06-23 LAB — CBC WITH DIFFERENTIAL/PLATELET
Abs Immature Granulocytes: 0.3 10*3/uL — ABNORMAL HIGH (ref 0.00–0.07)
Basophils Absolute: 0.1 10*3/uL (ref 0.0–0.1)
Basophils Relative: 1 %
Eosinophils Absolute: 0 10*3/uL (ref 0.0–0.5)
Eosinophils Relative: 0 %
HCT: 42.2 % (ref 39.0–52.0)
Hemoglobin: 13.8 g/dL (ref 13.0–17.0)
Immature Granulocytes: 3 %
Lymphocytes Relative: 14 %
Lymphs Abs: 1.7 10*3/uL (ref 0.7–4.0)
MCH: 28.7 pg (ref 26.0–34.0)
MCHC: 32.7 g/dL (ref 30.0–36.0)
MCV: 87.7 fL (ref 80.0–100.0)
Monocytes Absolute: 0.6 10*3/uL (ref 0.1–1.0)
Monocytes Relative: 5 %
Neutro Abs: 9.3 10*3/uL — ABNORMAL HIGH (ref 1.7–7.7)
Neutrophils Relative %: 77 %
Platelets: 341 10*3/uL (ref 150–400)
RBC: 4.81 MIL/uL (ref 4.22–5.81)
RDW: 13.9 % (ref 11.5–15.5)
WBC: 11.9 10*3/uL — ABNORMAL HIGH (ref 4.0–10.5)
nRBC: 0.2 % (ref 0.0–0.2)

## 2018-06-23 LAB — D-DIMER, QUANTITATIVE: D-Dimer, Quant: 0.62 ug/mL-FEU — ABNORMAL HIGH (ref 0.00–0.50)

## 2018-06-23 LAB — FERRITIN: Ferritin: 1039 ng/mL — ABNORMAL HIGH (ref 24–336)

## 2018-06-23 LAB — C-REACTIVE PROTEIN: CRP: <0.8 mg/dL (ref <1.0)

## 2018-06-23 MED ORDER — CHLORHEXIDINE GLUCONATE CLOTH 2 % EX PADS
6.0000 | MEDICATED_PAD | Freq: Every day | CUTANEOUS | Status: DC
  Administered 2018-06-23 – 2018-06-30 (×4): 6 via TOPICAL

## 2018-06-23 NOTE — Progress Notes (Signed)
[place] TEAM 1 - Stepdown/ICU TEAM  Becky Mckinzie  OLI:[id number] DOB: [birth year] DOA: 06/17/2018 PCP: Patient, No Pcp Per    Brief Narrative:  63yo w/ a history of alcohol and polysubstance abuse, depression, HTN, HLD, and COVID-19 positive on 5/17 who was admitted from his SNF 5/19 with progressive hypoxemic respiratory failure in the setting of bilateral pulmonary infiltrates.  He received Actemra, steroids, and some intermittent diuresis, despite which he had progressive hypoxemia and increasing oxygen requirement. Remdesivir was started 5/22. Moved to the ICU 5/23, at risk for intubation.    Significant Events: 5/19 admit from SNF 5/23 transfer to ICU - CTa w/o PE   COVID-19 specific Treatment: Actemra 5/19 & 5/21 Steroids 5/20 > 5/24 Remdesivir 5/22 >   Ventilator Settings:  NA  Subjective: Off Precedex at time of my eval.  Calm and conversant though somewhat confused.  Denies chest pain nausea vomiting or abdominal pain.  Reports only modest shortness of breath.  Assessment & Plan:  Acute hypoxic respiratory failure due to COVID-19 pneumonia Appears to be stabilizing both from a pulmonary standpoint and at behavioral standpoint -continues to require high level oxygen support but with climbing saturations - transfer to progressive care unit   Recent Labs    06/21/18 0345 06/22/18 0505 06/23/18 0500  DDIMER 0.62* 0.54* 0.62*  FERRITIN 990* 1,032* 1,039*  CRP 2.5* 1.4* <0.8    Acute metabolic encephalopathy Multifactorial - hx of paranoia/psych hx - acute delirium - steroids have been stopped -continue PRN Haldol -nightly dose of Zyprexa increased  Hypertension On home amlodipine - BP controlled presently  History of paranoia and polysubstance abuse Zyprexa dose increased -clonazepam discontinued due to need for intermittent IV benzo doses  HLD  DVT prophylaxis: lovenox  Code Status: FULL CODE Family Communication:  Disposition Plan: Transfer to  progressive care  Consultants:  PCCM  Antimicrobials:  none  Objective: Blood pressure 110/74, pulse 60, temperature 97.6 F (36.4 C), temperature source Axillary, resp. rate (!) 29, height 5\' 6"  (1.676 m), weight 86 kg, SpO2 94 %.  Intake/Output Summary (Last 24 hours) at 06/23/2018 0918 Last data filed at 06/23/2018 0800 Gross per 24 hour  Intake 1648.8 ml  Output 2475 ml  Net -826.2 ml   Filed Weights   06/21/18 0540 06/23/18 0220  Weight: 85.5 kg 86 kg    Examination: General: Respirations are unlabored Lungs: Diffuse fine crackles with no wheezing Cardiovascular: RRR without murmur Abdomen: NT/ND, soft, bs+, no mass  Extremities: trace edema B LE without significant change  CBC: Recent Labs  Lab 06/21/18 0345 06/21/18 1149 06/22/18 0505 06/23/18 0500  WBC 16.0*  --  14.8* 11.9*  NEUTROABS 13.2*  --  11.8* 9.3*  HGB 14.8 12.9* 14.2 13.8  HCT 44.9 38.0* 41.7 42.2  MCV 88.4  --  87.2 87.7  PLT 325  --  349 341   Basic Metabolic Panel: Recent Labs  Lab 06/20/18 0742 06/21/18 0345 06/21/18 1149 06/22/18 0505 06/23/18 0500  NA 140 145 142 143 146*  K 3.3* 4.2 3.3* 3.5 3.5  CL 105 105  --  106 108  CO2 23 25  --  27 27  GLUCOSE 174* 155*  --  144* 118*  BUN 28* 23  --  20 29*  CREATININE 0.94 0.97  --  0.79 0.94  CALCIUM 8.5* 9.2  --  8.8* 8.5*  MG 2.1 2.3  --  2.2  --    GFR: Estimated Creatinine Clearance: 80.6 mL/min (by C-G  formula based on SCr of 0.94 mg/dL).  Liver Function Tests: Recent Labs  Lab 06/20/18 0742 06/21/18 0345 06/22/18 0505 06/23/18 0500  AST 48* 57* 49* 82*  ALT 22 28 32 59*  ALKPHOS 45 56 45 38  BILITOT 0.3 0.4 0.3 0.7  PROT 6.7 7.4 6.7 6.2*  ALBUMIN 3.2* 3.7 3.4* 3.1*    Cardiac Enzymes: Recent Labs  Lab 06/17/18 1512  CKTOTAL 69    HbA1C: Hgb A1c MFr Bld  Date/Time Value Ref Range Status  05/19/2013 11:17 AM 5.6 <5.7 % Final    Comment:    (NOTE)                                                                        According to the ADA Clinical Practice Recommendations for 2011, when HbA1c is used as a screening test:  >=6.5%   Diagnostic of Diabetes Mellitus           (if abnormal result is confirmed) 5.7-6.4%   Increased risk of developing Diabetes Mellitus References:Diagnosis and Classification of Diabetes Mellitus,Diabetes Care,2011,34(Suppl 1):S62-S69 and Standards of Medical Care in         Diabetes - 2011,Diabetes Care,2011,34 (Suppl 1):S11-S61.    CBG: Recent Labs  Lab 06/18/18 0720  GLUCAP 111*    Recent Results (from the past 240 hour(s))  Blood Culture (routine x 2)     Status: None   Collection Time: 06/17/18  1:27 PM  Result Value Ref Range Status   Specimen Description BLOOD BLOOD LEFT ARM  Final   Special Requests   Final    BOTTLES DRAWN AEROBIC AND ANAEROBIC Blood Culture adequate volume   Culture   Final    NO GROWTH 5 DAYS Performed at [facility], [address] [place], [place] [address]    Report Status 06/22/2018 FINAL  Final  Blood Culture (routine x 2)     Status: None   Collection Time: 06/17/18  1:28 PM  Result Value Ref Range Status   Specimen Description BLOOD LEFT ANTECUBITAL  Final   Special Requests   Final    BOTTLES DRAWN AEROBIC AND ANAEROBIC Blood Culture adequate volume   Culture   Final    NO GROWTH 5 DAYS Performed at [facility], [address] [place], [place] [address]    Report Status 06/22/2018 FINAL  Final     Scheduled Meds: . aspirin EC  81 mg Oral Daily  . Chlorhexidine Gluconate Cloth  6 each Topical Q0600  . enoxaparin (LOVENOX) injection  40 mg Subcutaneous BID  . folic acid  1 mg Oral Daily  . multivitamin with minerals  1 tablet Oral Daily  . OLANZapine  7.5 mg Oral QHS  . pantoprazole  40 mg Oral Daily  . sodium chloride flush  3 mL Intravenous Q12H  . thiamine  100 mg Oral Daily  . vitamin C  500 mg Oral Daily  . zinc sulfate  220 mg Oral Daily     LOS: 6 days   Lonia BloodJeffrey T.  Davonn Flanery, MD [facility] Office  [phone] Pager - Text Page per Amion  If 7PM-7AM, please contact night-coverage per Amion 06/23/2018, 9:18 AM

## 2018-06-23 NOTE — Progress Notes (Signed)
NAME:  [name] PerchesKeith [name], MRN:  [id number], DOB:  11/24/1955, LOS: 6 ADMISSION DATE:  06/17/2018, CONSULTATION DATE:  5/23 REFERRING MD:  Kerry HoughMemon, CHIEF COMPLAINT:  Acute respiratory failure with hypoxemia   Brief History   63 y/o male admitted on May 17 with acute respiratory failure with hypoxemia due to COVID-19 pneumonia.  Moved to the intensive care unit on May 23.   Past Medical History  Anxiety Depression Polysubstance abuse Hypertension Hyperlipidemia  Significant Hospital Events   May 23 moved to the intensive care unit  Consults:  Pulmonary and critical care medicine  Procedures:    Significant Diagnostic Tests:  May 23 CT angiogram chest: No pulmonary embolism, atypical pneumonia findings noted, aortic atherosclerosis, trace left-sided pleural effusion, emphysema noted  Micro Data:  5/17 SARS COV2 at SNF > positive 5/19 blood > never collected  Antimicrobials:  Remdesivir 5/22 >>    Interim history/subjective:  Mental status improved Bradycardia overnight Off Precedex   Objective   Blood pressure 92/64, pulse (!) 52, temperature 97.6 F (36.4 C), temperature source Axillary, resp. rate (!) 24, height 5\' 6"  (1.676 m), weight 86 kg, SpO2 96 %.    FiO2 (%):  [100 %] 100 %   Intake/Output Summary (Last 24 hours) at 06/23/2018 0756 Last data filed at 06/23/2018 0700 Gross per 24 hour  Intake 1758.8 ml  Output 2475 ml  Net -716.2 ml   Filed Weights   06/21/18 0540 06/23/18 0220  Weight: 85.5 kg 86 kg    Examination:  General:  Resting comfortably in bed HENT: NCAT OP clear PULM: CTA B, normal effort CV: RRR, no mgr GI: BS+, soft, nontender MSK: normal bulk and tone Neuro: awake, alert, no distress, MAEW   Resolved Hospital Problem list     Assessment & Plan:  Acute respiratory failure with hypoxemia due to severe COVID-19 Status post Actemra Off corticosteroids Continue Remdesivir Monitor chest x-ray Monitor inflammatory markers Administer  oxygen to maintain O2 saturation greater than 88%  Severe agitation/ICU delirium: Continue home Zyprexa/clonazepam, seems to be tolerating this well Stop Precedex    Best practice:  Diet: heart healthy Pain/Anxiety/Delirium protocol (if indicated): n/a VAP protocol (if indicated): n/a DVT prophylaxis: lovenox GI prophylaxis: n/a Glucose control: monitor Mobility: out of bed Code Status: full Family Communication: per [facility] Disposition: Likely transfer out today if remains stable  Labs   CBC: Recent Labs  Lab 06/19/18 0407 06/20/18 0742 06/21/18 0345 06/21/18 1149 06/22/18 0505 06/23/18 0500  WBC 16.5* 16.5* 16.0*  --  14.8* 11.9*  NEUTROABS 14.9* 14.2* 13.2*  --  11.8* 9.3*  HGB 14.1 13.7 14.8 12.9* 14.2 13.8  HCT 42.4 40.0 44.9 38.0* 41.7 42.2  MCV 88.5 86.8 88.4  --  87.2 87.7  PLT 232 290 325  --  349 341    Basic Metabolic Panel: Recent Labs  Lab 06/19/18 0407 06/20/18 0742 06/21/18 0345 06/21/18 1149 06/22/18 0505 06/23/18 0500  NA 141 140 145 142 143 146*  K 3.4* 3.3* 4.2 3.3* 3.5 3.5  CL 102 105 105  --  106 108  CO2 29 23 25   --  27 27  GLUCOSE 171* 174* 155*  --  144* 118*  BUN 25* 28* 23  --  20 29*  CREATININE 0.95 0.94 0.97  --  0.79 0.94  CALCIUM 8.6* 8.5* 9.2  --  8.8* 8.5*  MG 2.1 2.1 2.3  --  2.2  --    GFR: Estimated Creatinine Clearance: 80.6 mL/min (by C-G  formula based on SCr of 0.94 mg/dL). Recent Labs  Lab 06/17/18 1326  06/17/18 1512 06/17/18 2220  06/20/18 0742 06/21/18 0345 06/22/18 0505 06/23/18 0500  PROCALCITON  --   --   --  <0.10  --   --   --   --   --   WBC  --    < >  --  9.5   < > 16.5* 16.0* 14.8* 11.9*  LATICACIDVEN 1.3  --  0.8  --   --   --   --   --   --    < > = values in this interval not displayed.    Liver Function Tests: Recent Labs  Lab 06/19/18 0407 06/20/18 0742 06/21/18 0345 06/22/18 0505 06/23/18 0500  AST 41 48* 57* 49* 82*  ALT 19 22 28  32 59*  ALKPHOS 38 45 56 45 38  BILITOT 0.4 0.3  0.4 0.3 0.7  PROT 7.0 6.7 7.4 6.7 6.2*  ALBUMIN 3.2* 3.2* 3.7 3.4* 3.1*   No results for input(s): LIPASE, AMYLASE in the last 168 hours. No results for input(s): AMMONIA in the last 168 hours.  ABG    Component Value Date/Time   PHART 7.555 (H) 06/21/2018 1149   PCO2ART 27.8 (L) 06/21/2018 1149   PO2ART 58.0 (L) 06/21/2018 1149   HCO3 24.7 06/21/2018 1149   TCO2 26 06/21/2018 1149   O2SAT 94.0 06/21/2018 1149     Coagulation Profile: No results for input(s): INR, PROTIME in the last 168 hours.  Cardiac Enzymes: Recent Labs  Lab 06/17/18 1512  CKTOTAL 69    HbA1C: Hgb A1c MFr Bld  Date/Time Value Ref Range Status  05/19/2013 11:17 AM 5.6 <5.7 % Final    Comment:    (NOTE)                                                                       According to the ADA Clinical Practice Recommendations for 2011, when HbA1c is used as a screening test:  >=6.5%   Diagnostic of Diabetes Mellitus           (if abnormal result is confirmed) 5.7-6.4%   Increased risk of developing Diabetes Mellitus References:Diagnosis and Classification of Diabetes Mellitus,Diabetes Care,2011,34(Suppl 1):S62-S69 and Standards of Medical Care in         Diabetes - 2011,Diabetes Care,2011,34 (Suppl 1):S11-S61.    CBG: Recent Labs  Lab 06/18/18 0720  GLUCAP 111*       Critical care time: 35 minutes     Heber Christine, MD [place] [facility] Pager: [phone] Cell: [phone] If no response, call [phone]

## 2018-06-24 ENCOUNTER — Encounter (HOSPITAL_COMMUNITY): Payer: Self-pay | Admitting: *Deleted

## 2018-06-24 DIAGNOSIS — F1010 Alcohol abuse, uncomplicated: Secondary | ICD-10-CM

## 2018-06-24 LAB — COMPREHENSIVE METABOLIC PANEL
ALT: 112 U/L — ABNORMAL HIGH (ref 0–44)
AST: 120 U/L — ABNORMAL HIGH (ref 15–41)
Albumin: 3.2 g/dL — ABNORMAL LOW (ref 3.5–5.0)
Alkaline Phosphatase: 47 U/L (ref 38–126)
Anion gap: 12 (ref 5–15)
BUN: 26 mg/dL — ABNORMAL HIGH (ref 8–23)
CO2: 24 mmol/L (ref 22–32)
Calcium: 8.2 mg/dL — ABNORMAL LOW (ref 8.9–10.3)
Chloride: 106 mmol/L (ref 98–111)
Creatinine, Ser: 0.88 mg/dL (ref 0.61–1.24)
GFR calc Af Amer: >60 mL/min (ref >60)
GFR calc non Af Amer: >60 mL/min (ref >60)
Glucose, Bld: 95 mg/dL (ref 70–99)
Potassium: 2.8 mmol/L — ABNORMAL LOW (ref 3.5–5.1)
Sodium: 142 mmol/L (ref 135–145)
Total Bilirubin: 0.8 mg/dL (ref 0.3–1.2)
Total Protein: 6.1 g/dL — ABNORMAL LOW (ref 6.5–8.1)

## 2018-06-24 LAB — MRSA PCR SCREENING: MRSA by PCR: NEGATIVE

## 2018-06-24 LAB — CBC WITH DIFFERENTIAL/PLATELET
Abs Immature Granulocytes: 0.38 10*3/uL — ABNORMAL HIGH (ref 0.00–0.07)
Basophils Absolute: 0 10*3/uL (ref 0.0–0.1)
Basophils Relative: 0 %
Eosinophils Absolute: 0.2 10*3/uL (ref 0.0–0.5)
Eosinophils Relative: 2 %
HCT: 45.5 % (ref 39.0–52.0)
Hemoglobin: 15.2 g/dL (ref 13.0–17.0)
Immature Granulocytes: 4 %
Lymphocytes Relative: 17 %
Lymphs Abs: 1.5 10*3/uL (ref 0.7–4.0)
MCH: 29.2 pg (ref 26.0–34.0)
MCHC: 33.4 g/dL (ref 30.0–36.0)
MCV: 87.3 fL (ref 80.0–100.0)
Monocytes Absolute: 0.2 10*3/uL (ref 0.1–1.0)
Monocytes Relative: 2 %
Neutro Abs: 6.7 10*3/uL (ref 1.7–7.7)
Neutrophils Relative %: 75 %
Platelets: 363 10*3/uL (ref 150–400)
RBC: 5.21 MIL/uL (ref 4.22–5.81)
RDW: 13.5 % (ref 11.5–15.5)
WBC: 9 10*3/uL (ref 4.0–10.5)
nRBC: 0.2 % (ref 0.0–0.2)

## 2018-06-24 LAB — FERRITIN: Ferritin: 1154 ng/mL — ABNORMAL HIGH (ref 24–336)

## 2018-06-24 LAB — C-REACTIVE PROTEIN: CRP: <0.8 mg/dL (ref <1.0)

## 2018-06-24 LAB — D-DIMER, QUANTITATIVE: D-Dimer, Quant: 1.39 ug/mL-FEU — ABNORMAL HIGH (ref 0.00–0.50)

## 2018-06-24 MED ORDER — POTASSIUM CHLORIDE CRYS ER 20 MEQ PO TBCR
40.0000 meq | EXTENDED_RELEASE_TABLET | Freq: Once | ORAL | Status: AC
  Administered 2018-06-24: 21:00:00 40 meq via ORAL
  Filled 2018-06-24: qty 2

## 2018-06-24 MED ORDER — POTASSIUM CHLORIDE 20 MEQ/15ML (10%) PO SOLN
40.0000 meq | ORAL | Status: AC
  Administered 2018-06-24 (×2): 40 meq
  Filled 2018-06-24: qty 30

## 2018-06-24 NOTE — Progress Notes (Signed)
Updated pt's sister and pt also spoke with his sister

## 2018-06-24 NOTE — Progress Notes (Signed)
NAME:  [name] PerchesKeith Pociask, MRN:  [id number], DOB:  05/18/1955, LOS: 7 ADMISSION DATE:  06/17/2018, CONSULTATION DATE:  5/23 REFERRING MD:  Kerry HoughMemon, CHIEF COMPLAINT:  Acute respiratory failure with hypoxemia   Brief History   63 y/o male admitted on May 17 with acute respiratory failure with hypoxemia due to COVID-19 pneumonia.  Moved to the intensive care unit on May 23.   Past Medical History  Anxiety Depression Polysubstance abuse Hypertension Hyperlipidemia  Significant Hospital Events   May 23 moved to the intensive care unit  Consults:  Pulmonary and critical care medicine  Procedures:    Significant Diagnostic Tests:  May 23 CT angiogram chest: No pulmonary embolism, atypical pneumonia findings noted, aortic atherosclerosis, trace left-sided pleural effusion, emphysema noted  Micro Data:  5/17 SARS COV2 at SNF > positive 5/19 blood > never collected  Antimicrobials:  Remdesivir 5/22 >>    Interim history/subjective:   Some intermittent confusion but easily redirectable Oxygenation improved  Objective   Blood pressure 103/87, pulse 84, temperature 98.4 F (36.9 C), temperature source Oral, resp. rate (!) 24, height 5\' 6"  (1.676 m), weight 86 kg, SpO2 (!) 87 %.    FiO2 (%):  [100 %] 100 %   Intake/Output Summary (Last 24 hours) at 06/24/2018 [id number] Last data filed at 06/24/2018 0600 Gross per 24 hour  Intake 1459.99 ml  Output 800 ml  Net 659.99 ml   Filed Weights   06/21/18 0540 06/23/18 0220  Weight: 85.5 kg 86 kg    Examination:  General:  Resting comfortably in bed HENT: NCAT OP clear PULM: CTA B, normal effort CV: RRR, no mgr GI: BS+, soft, nontender MSK: normal bulk and tone Neuro: awake, alert, no distress, MAEW    Resolved Hospital Problem list     Assessment & Plan:  Acute respiratory failure with hypoxemia due to severe COVID-19 Continue remdesivir for 5 day course CXR prn Wean off O2 to maintain O2 saturation > 85% Monitor for mental  status change, findings of increased work of breathing   Severe agitation/ICU delirium: Continue Zyprexa/clonazepam  PCCM to sign off  Best practice:  Diet: heart healthy Pain/Anxiety/Delirium protocol (if indicated): n/a VAP protocol (if indicated): n/a DVT prophylaxis: lovenox GI prophylaxis: n/a Glucose control: monitor Mobility: out of bed Code Status: full [facility] Dis[facility]: to [facility]  Labs   CBC: Recent Labs  Lab 06/20/18 0742 06/21/18 0345 06/21/18 1149 06/22/18 0505 06/23/18 0500 06/24/18 0206  WBC 16.5* 16.0*  --  14.8* 11.9* 9.0  NEUTROABS 14.2* 13.2*  --  11.8* 9.3* 6.7  HGB 13.7 14.8 12.9* 14.2 13.8 15.2  HCT 40.0 44.9 38.0* 41.7 42.2 45.5  MCV 86.8 88.4  --  87.2 87.7 87.3  PLT 290 325  --  349 341 363    Basic Metabolic Panel: Recent Labs  Lab 06/19/18 0407 06/20/18 0742 06/21/18 0345 06/21/18 1149 06/22/18 0505 06/23/18 0500 06/24/18 0206  NA 141 140 145 142 143 146* 142  K 3.4* 3.3* 4.2 3.3* 3.5 3.5 2.8*  CL 102 105 105  --  106 108 106  CO2 29 23 25   --  27 27 24   GLUCOSE 171* 174* 155*  --  144* 118* 95  BUN 25* 28* 23  --  20 29* 26*  CREATININE 0.95 0.94 0.97  --  0.79 0.94 0.88  CALCIUM 8.6* 8.5* 9.2  --  8.8* 8.5* 8.2*  MG 2.1 2.1 2.3  --  2.2  --   --  GFR: Estimated Creatinine Clearance: 86.1 mL/min (by C-G formula based on SCr of 0.88 mg/dL). Recent Labs  Lab 06/17/18 1326  06/17/18 1512 06/17/18 2220  06/21/18 0345 06/22/18 0505 06/23/18 0500 06/24/18 0206  PROCALCITON  --   --   --  <0.10  --   --   --   --   --   WBC  --    < >  --  9.5   < > 16.0* 14.8* 11.9* 9.0  LATICACIDVEN 1.3  --  0.8  --   --   --   --   --   --    < > = values in this interval not displayed.    Liver Function Tests: Recent Labs  Lab 06/20/18 0742 06/21/18 0345 06/22/18 0505 06/23/18 0500 06/24/18 0206  AST 48* 57* 49* 82* 120*  ALT 22 28 32 59* 112*  ALKPHOS 45 56 45 38 47  BILITOT 0.3 0.4 0.3 0.7 0.8   PROT 6.7 7.4 6.7 6.2* 6.1*  ALBUMIN 3.2* 3.7 3.4* 3.1* 3.2*   No results for input(s): LIPASE, AMYLASE in the last 168 hours. No results for input(s): AMMONIA in the last 168 hours.  ABG    Component Value Date/Time   PHART 7.555 (H) 06/21/2018 1149   PCO2ART 27.8 (L) 06/21/2018 1149   PO2ART 58.0 (L) 06/21/2018 1149   HCO3 24.7 06/21/2018 1149   TCO2 26 06/21/2018 1149   O2SAT 94.0 06/21/2018 1149     Coagulation Profile: No results for input(s): INR, PROTIME in the last 168 hours.  Cardiac Enzymes: Recent Labs  Lab 06/17/18 1512  CKTOTAL 69    HbA1C: Hgb A1c MFr Bld  Date/Time Value Ref Range Status  05/19/2013 11:17 AM 5.6 <5.7 % Final    Comment:    (NOTE)                                                                       According to the ADA Clinical Practice Recommendations for 2011, when HbA1c is used as a screening test:  >=6.5%   Diagnostic of Diabetes Mellitus           (if abnormal result is confirmed) 5.7-6.4%   Increased risk of developing Diabetes Mellitus References:Diagnosis and Classification of Diabetes Mellitus,Diabetes Care,2011,34(Suppl 1):S62-S69 and Standards of Medical Care in         Diabetes - 2011,Diabetes Care,2011,34 (Suppl 1):S11-S61.    CBG: Recent Labs  Lab 06/18/18 0720  GLUCAP 111*       Critical care time: n/a     Heber [place], MD [place] [facility] Pager: [phone] Cell: [phone] If no response, call [phone]

## 2018-06-24 NOTE — Progress Notes (Signed)
@  approx 2120 updated pt's sister, Madie Ren[place], regarding pt's condition. All questions answered.

## 2018-06-24 NOTE — Progress Notes (Signed)
[facility] TEAM 1 - Stepdown/ICU TEAM  Gerhard PerchesKeith Thumm  GYI:[id number]:[id number] DOB: 1955-02-16 DOA: 06/17/2018 PCP: Patient, No Pcp Per    Brief Narrative:  63yo w/ a history of alcohol and polysubstance abuse, depression, HTN, HLD, and COVID-19 positive on 5/17 who was admitted from his SNF 5/19 with progressive hypoxemic respiratory failure in the setting of bilateral pulmonary infiltrates.  He received Actemra, steroids, and some intermittent diuresis, despite which he had progressive hypoxemia and increasing oxygen requirement. Remdesivir was started 5/22. Moved to the ICU 5/23, at risk for intubation.    Significant Events: 5/19 admit from SNF 5/23 transfer to ICU - CTa w/o PE   COVID-19 specific Treatment: Actemra 5/19 & 5/21 Steroids 5/20 > 5/24 Remdesivir 5/22 >   Ventilator Settings:  NA  Subjective: Alert and conversant.  Does not appear to be in acute distress.  Remains confused and at times needs verbal redirecting but is not agitated at present.  Denies chest pain nausea vomiting or abdominal pain.  Assessment & Plan:  Acute hypoxic respiratory failure due to COVID-19 pneumonia Appears to be stabilizing both from a pulmonary standpoint and a behavioral standpoint -continues to require high level oxygen support but with climbing saturations - transfer to progressive care unit entered 5/25   Recent Labs    06/22/18 0505 06/23/18 0500 06/24/18 0206  DDIMER 0.54* 0.62* 1.39*  FERRITIN 1,032* 1,039* 1,154*  CRP 1.4* <0.8 <0.8    Acute metabolic encephalopathy Multifactorial - hx of paranoia/psych hx - acute delirium - steroids have been stopped - continue PRN Haldol - nightly dose of Zyprexa increased -follow without further change at this time  Hypertension On home amlodipine - BP controlled presently  Hypokalemia Supplement and f/u in AM - check Mg  History of paranoia and polysubstance abuse Zyprexa dose increased -clonazepam discontinued due to need for intermittent  IV benzo doses  HLD  DVT prophylaxis: lovenox  Code Status: FULL CODE Family Communication:  Disposition Plan: Transfer to progressive care  Consultants:  PCCM  Antimicrobials:  none  Objective: Blood pressure 132/83, pulse 74, temperature 98.4 F (36.9 C), temperature source Oral, resp. rate (!) 26, height 5\' 6"  (1.676 m), weight 86 kg, SpO2 92 %.  Intake/Output Summary (Last 24 hours) at 06/24/2018 0840 Last data filed at 06/24/2018 0600 Gross per 24 hour  Intake 1239.99 ml  Output 800 ml  Net 439.99 ml   Filed Weights   06/21/18 0540 06/23/18 0220  Weight: 85.5 kg 86 kg    Examination: General: Respirations are unlabored - alert and conversant Lungs: Diffuse fine crackles without change Cardiovascular: RRR without murmur Abdomen: NT/ND, soft, bs+, no mass  Extremities: trace edema B LE which is stable  CBC: Recent Labs  Lab 06/22/18 0505 06/23/18 0500 06/24/18 0206  WBC 14.8* 11.9* 9.0  NEUTROABS 11.8* 9.3* 6.7  HGB 14.2 13.8 15.2  HCT 41.7 42.2 45.5  MCV 87.2 87.7 87.3  PLT 349 341 363   Basic Metabolic Panel: Recent Labs  Lab 06/20/18 0742 06/21/18 0345  06/22/18 0505 06/23/18 0500 06/24/18 0206  NA 140 145   < > 143 146* 142  K 3.3* 4.2   < > 3.5 3.5 2.8*  CL 105 105  --  106 108 106  CO2 23 25  --  27 27 24   GLUCOSE 174* 155*  --  144* 118* 95  BUN 28* 23  --  20 29* 26*  CREATININE 0.94 0.97  --  0.79 0.94 0.88  CALCIUM  8.5* 9.2  --  8.8* 8.5* 8.2*  MG 2.1 2.3  --  2.2  --   --    < > = values in this interval not displayed.   GFR: Estimated Creatinine Clearance: 86.1 mL/min (by C-G formula based on SCr of 0.88 mg/dL).  Liver Function Tests: Recent Labs  Lab 06/21/18 0345 06/22/18 0505 06/23/18 0500 06/24/18 0206  AST 57* 49* 82* 120*  ALT 28 32 59* 112*  ALKPHOS 56 45 38 47  BILITOT 0.4 0.3 0.7 0.8  PROT 7.4 6.7 6.2* 6.1*  ALBUMIN 3.7 3.4* 3.1* 3.2*    Cardiac Enzymes: Recent Labs  Lab 06/17/18 1512  CKTOTAL 69     HbA1C: Hgb A1c MFr Bld  Date/Time Value Ref Range Status  05/19/2013 11:17 AM 5.6 <5.7 % Final    Comment:    (NOTE)                                                                       According to the ADA Clinical Practice Recommendations for 2011, when HbA1c is used as a screening test:  >=6.5%   Diagnostic of Diabetes Mellitus           (if abnormal result is confirmed) 5.7-6.4%   Increased risk of developing Diabetes Mellitus References:Diagnosis and Classification of Diabetes Mellitus,Diabetes Care,2011,34(Suppl 1):S62-S69 and Standards of Medical Care in         Diabetes - 2011,Diabetes Care,2011,34 (Suppl 1):S11-S61.    CBG: Recent Labs  Lab 06/18/18 0720  GLUCAP 111*    Recent Results (from the past 240 hour(s))  Blood Culture (routine x 2)     Status: None   Collection Time: 06/17/18  1:27 PM  Result Value Ref Range Status   Specimen Description BLOOD BLOOD LEFT ARM  Final   Special Requests   Final    BOTTLES DRAWN AEROBIC AND ANAEROBIC Blood Culture adequate volume   Culture   Final    NO GROWTH 5 DAYS Performed at [facility], [address] [place], [place] [address]    Report Status 06/22/2018 FINAL  Final  Blood Culture (routine x 2)     Status: None   Collection Time: 06/17/18  1:28 PM  Result Value Ref Range Status   Specimen Description BLOOD LEFT ANTECUBITAL  Final   Special Requests   Final    BOTTLES DRAWN AEROBIC AND ANAEROBIC Blood Culture adequate volume   Culture   Final    NO GROWTH 5 DAYS Performed at [facility], [address], [place], [place] [address]    Report Status 06/22/2018 FINAL  Final     Scheduled Meds: . aspirin EC  81 mg Oral Daily  . Chlorhexidine Gluconate Cloth  6 each Topical Q0600  . enoxaparin (LOVENOX) injection  40 mg Subcutaneous BID  . folic acid  1 mg Oral Daily  . multivitamin with minerals  1 tablet Oral Daily  . OLANZapine  7.5 mg Oral QHS  . pantoprazole  40 mg Oral Daily  .  potassium chloride  40 mEq Per Tube Q4H  . sodium chloride flush  3 mL Intravenous Q12H  . thiamine  100 mg Oral Daily  . vitamin C  500 mg Oral  Daily  . zinc sulfate  220 mg Oral Daily     LOS: 7 days   Lonia Blood, MD [facility] Office  [phone] Pager - Text Page per Amion  If 7PM-7AM, please contact night-coverage per Amion 06/24/2018, 8:40 AM

## 2018-06-24 NOTE — Progress Notes (Signed)
eLink Physician-Brief Progress Note Patient Name: [name] [name] DOB: [birth year] MRN: [id number]   Date of Service  06/24/2018  HPI/Events of Note  Hypokalemia - K+ = 2.8 and Creatinine = 0.88  eICU Interventions  Will replace K+.      Intervention Category Major Interventions: Electrolyte abnormality - evaluation and management  Sommer,Steven Eugene 06/24/2018, 7:00 AM

## 2018-06-25 ENCOUNTER — Encounter (HOSPITAL_COMMUNITY): Payer: Self-pay | Admitting: *Deleted

## 2018-06-25 ENCOUNTER — Inpatient Hospital Stay (HOSPITAL_COMMUNITY): Payer: Medicare Other

## 2018-06-25 LAB — CBC WITH DIFFERENTIAL/PLATELET
Abs Immature Granulocytes: 0.26 10*3/uL — ABNORMAL HIGH (ref 0.00–0.07)
Basophils Absolute: 0 10*3/uL (ref 0.0–0.1)
Basophils Relative: 1 %
Eosinophils Absolute: 0.1 10*3/uL (ref 0.0–0.5)
Eosinophils Relative: 2 %
HCT: 43 % (ref 39.0–52.0)
Hemoglobin: 13.9 g/dL (ref 13.0–17.0)
Immature Granulocytes: 4 %
Lymphocytes Relative: 20 %
Lymphs Abs: 1.4 10*3/uL (ref 0.7–4.0)
MCH: 29 pg (ref 26.0–34.0)
MCHC: 32.3 g/dL (ref 30.0–36.0)
MCV: 89.6 fL (ref 80.0–100.0)
Monocytes Absolute: 0.3 10*3/uL (ref 0.1–1.0)
Monocytes Relative: 4 %
Neutro Abs: 5 10*3/uL (ref 1.7–7.7)
Neutrophils Relative %: 69 %
Platelets: 313 10*3/uL (ref 150–400)
RBC: 4.8 MIL/uL (ref 4.22–5.81)
RDW: 13.7 % (ref 11.5–15.5)
WBC: 7 10*3/uL (ref 4.0–10.5)
nRBC: 0.3 % — ABNORMAL HIGH (ref 0.0–0.2)

## 2018-06-25 LAB — FERRITIN: Ferritin: 730 ng/mL — ABNORMAL HIGH (ref 24–336)

## 2018-06-25 LAB — HIV ANTIBODY (ROUTINE TESTING W REFLEX): HIV Screen 4th Generation wRfx: NONREACTIVE

## 2018-06-25 LAB — COMPREHENSIVE METABOLIC PANEL
ALT: 74 U/L — ABNORMAL HIGH (ref 0–44)
AST: 53 U/L — ABNORMAL HIGH (ref 15–41)
Albumin: 3 g/dL — ABNORMAL LOW (ref 3.5–5.0)
Alkaline Phosphatase: 38 U/L (ref 38–126)
Anion gap: 8 (ref 5–15)
BUN: 21 mg/dL (ref 8–23)
CO2: 28 mmol/L (ref 22–32)
Calcium: 8.4 mg/dL — ABNORMAL LOW (ref 8.9–10.3)
Chloride: 108 mmol/L (ref 98–111)
Creatinine, Ser: 0.84 mg/dL (ref 0.61–1.24)
GFR calc Af Amer: >60 mL/min (ref >60)
GFR calc non Af Amer: >60 mL/min (ref >60)
Glucose, Bld: 99 mg/dL (ref 70–99)
Potassium: 3.9 mmol/L (ref 3.5–5.1)
Sodium: 144 mmol/L (ref 135–145)
Total Bilirubin: 0.9 mg/dL (ref 0.3–1.2)
Total Protein: 5.6 g/dL — ABNORMAL LOW (ref 6.5–8.1)

## 2018-06-25 LAB — C-REACTIVE PROTEIN: CRP: 0.8 mg/dL (ref <1.0)

## 2018-06-25 LAB — D-DIMER, QUANTITATIVE: D-Dimer, Quant: 1.47 ug/mL-FEU — ABNORMAL HIGH (ref 0.00–0.50)

## 2018-06-25 LAB — MAGNESIUM: Magnesium: 2.2 mg/dL (ref 1.7–2.4)

## 2018-06-25 MED ORDER — FUROSEMIDE 10 MG/ML IJ SOLN
40.0000 mg | Freq: Once | INTRAMUSCULAR | Status: AC
  Administered 2018-06-25: 40 mg via INTRAVENOUS
  Filled 2018-06-25: qty 4

## 2018-06-25 MED ORDER — POTASSIUM CHLORIDE CRYS ER 20 MEQ PO TBCR
40.0000 meq | EXTENDED_RELEASE_TABLET | Freq: Once | ORAL | Status: AC
  Administered 2018-06-25: 40 meq via ORAL
  Filled 2018-06-25: qty 2

## 2018-06-25 NOTE — Evaluation (Signed)
Occupational Therapy Evaluation Patient Details Name: [name] [name] MRN: [id number] DOB: 02-27-1955 Today's Date: 06/25/2018    History of Present Illness 63yo w/ a history of alcohol and polysubstance abuse, depression, HTN, HLD, and COVID-19 positive on 5/17 who was admitted fromhisSNF 5/19 with progressive hypoxemic respiratory failure in the setting of bilateral pulmonary infiltrates. COVIC +  To ICU however not intubated.    Clinical Impression   PTA, pt was living at [facility] and unsure of PLOF for ADLs. Pt however reporting he lives at his home and is independent. Upon arrival, pt supine in bed with  off and SpO2 at 83-80% on RA. On 15L HFNC, pt SpO2 >90% throughout session. Pt using the urinal while standing with Min A for balance. Pt performed hand hygiene at sink with Min A for balance. Pt requiring Min-Mod A for functional mobility using RW in room; Mod A for occasional posterior lean. Pt would benefit from further acute OT to facilitate safe dc. Recommend dc to SNF for further OT to optimize safety, independence with ADLs, and return to PLOF.      Follow Up Recommendations  SNF;Supervision/Assistance - 24 hour    Equipment Recommendations  Other (comment)(Defer to next venue)    Recommendations for Other Services PT consult     Precautions / Restrictions Precautions Precautions: Fall Precaution Comments: monitor sats Restrictions Weight Bearing Restrictions: No      Mobility Bed Mobility Overal bed mobility: Needs Assistance Bed Mobility: Supine to Sit     Supine to sit: Min guard     General bed mobility comments: Pt using rail and pushing up into sitting with increased time and effort  Transfers Overall transfer level: Needs assistance Equipment used: Rolling walker (2 wheeled) Transfers: Sit to/from Stand Sit to Stand: Min assist         General transfer comment: Min A for power up into standing.    Balance Overall balance assessment: Needs  assistance Sitting-balance support: No upper extremity supported;Feet supported Sitting balance-Leahy Scale: Good     Standing balance support: No upper extremity supported Standing balance-Leahy Scale: Poor Standing balance comment: Pt washing his hands at sink and requiring Min A for posteiorr lean                           ADL either performed or assessed with clinical judgement   ADL Overall ADL's : Needs assistance/impaired Eating/Feeding: Set up;Supervision/ safety;Sitting   Grooming: Wash/dry hands;Minimal assistance;Standing Grooming Details (indicate cue type and reason): Pt performing Min A for balance while performing hand hygiene. Upper Body Bathing: Minimal assistance;Sitting   Lower Body Bathing: Moderate assistance;Sit to/from stand   Upper Body Dressing : Minimal assistance;Sitting   Lower Body Dressing: Maximal assistance;Sit to/from stand   Toilet Transfer: Minimal assistance;Moderate assistance;Ambulation;RW(simulated to recliner)   Toileting- Clothing Manipulation and Hygiene: Minimal assistance;Sit to/from stand Toileting - Clothing Manipulation Details (indicate cue type and reason): Pt managing urinal in standing with Min A for holding gown and maintaining balance     Functional mobility during ADLs: Minimal assistance;Moderate assistance;Rolling walker General ADL Comments: Pt presenting with decreased balance, cognition, and activity tolerance     Vision Baseline Vision/History: Wears glasses Wears Glasses: At all times Patient Visual Report: No change from baseline       Perception     Praxis      Pertinent Vitals/Pain Pain Assessment: No/denies pain     Hand Dominance  Extremity/Trunk Assessment Upper Extremity Assessment Upper Extremity Assessment: Generalized weakness   Lower Extremity Assessment Lower Extremity Assessment: Defer to PT evaluation   Cervical / Trunk Assessment Cervical / Trunk Assessment: Kyphotic    Communication Communication Communication: HOH   Cognition Arousal/Alertness: Awake/alert Behavior During Therapy: Flat affect Overall Cognitive Status: No family/caregiver present to determine baseline cognitive functioning(impaired at baseline per chart)                                     General Comments  Upon arrival, pt had pulled off his [place] and was 82-80% on RA. SpO2 >90% on 15L HFNC. Initial HR 80s-90s and RR 20s. post HR 110 and RR 30s    Exercises     Shoulder Instructions      Home Living Family/patient expects to be discharged to:: Skilled nursing facility                                 Additional Comments: Pt reporting he lives in a [address] and "had lives there for years."      Prior Functioning/Environment Level of Independence: Needs assistance  Gait / Transfers Assistance Needed: Pt reports he doesn't use RW ADL's / Homemaking Assistance Needed: Pt reports he performs all ADLs            OT Problem List: Decreased strength;Decreased range of motion;Decreased activity tolerance;Impaired balance (sitting and/or standing);Decreased knowledge of use of DME or AE;Decreased knowledge of precautions;Decreased safety awareness;Decreased cognition      OT Treatment/Interventions: Self-care/ADL training;Therapeutic exercise;Energy conservation;DME and/or AE instruction;Therapeutic activities;Patient/family education;Balance training    OT Goals(Current goals can be found in the care plan section) Acute Rehab OT Goals Patient Stated Goal: Agreeable to use urinal standing OT Goal Formulation: With patient Time For Goal Achievement: 07/09/18 Potential to Achieve Goals: Good  OT Frequency: Min 3X/week   Barriers to D/C:            Co-evaluation              AM-PAC OT "6 Clicks" Daily Activity     Outcome Measure Help from another person eating meals?: None Help from another person taking care of personal  grooming?: A Little Help from another person toileting, which includes using toliet, bedpan, or urinal?: A Little Help from another person bathing (including washing, rinsing, drying)?: A Lot Help from another person to put on and taking off regular upper body clothing?: A Little Help from another person to put on and taking off regular lower body clothing?: A Lot 6 Click Score: 17   End of Session Equipment Utilized During Treatment: Rolling walker;Oxygen(15L HFNC) Nurse Communication: Mobility status  Activity Tolerance: Patient tolerated treatment well Patient left: in chair;with call bell/phone within reach;with chair alarm set  OT Visit Diagnosis: Unsteadiness on feet (R26.81);Other abnormalities of gait and mobility (R26.89);Muscle weakness (generalized) (M62.81);Other symptoms and signs involving cognitive function                Time: [id number] OT Time Calculation (min): 28 min Charges:  OT General Charges $OT Visit: 1 Visit OT Evaluation $OT Eval Moderate Complexity: 1 Mod OT Treatments $Self Care/Home Management : 8-22 mins  Wynton Hufstetler MSOT, OTR/L Acute Rehab Pager: [phone] Office: [phone]  Theodoro GristCharis M Rhea Thrun 06/25/2018, 4:16 PM

## 2018-06-25 NOTE — Evaluation (Signed)
Physical Therapy Evaluation Patient Details Name: [name] [name] MRN: [id number] DOB: 1955/06/28 Today's Date: 06/25/2018   History of Present Illness  63yo w/ a history of alcohol and polysubstance abuse, depression, HTN, HLD, and COVID-19 positive on 5/17 who was admitted fromhisSNF 5/19 with progressive hypoxemic respiratory failure in the setting of bilateral pulmonary infiltrates. COVIC +  To ICU however not intubated.     Clinical Impression  Pt admitted with above diagnosis. Patient refused use of RW and states he did not use one at SNF (although CSW intake note his sister reported he uses a walker?)  Due to decreased SaO2 to 82% with stand-pivot to chair while on HFNC @15L  deferred attempt to ambulate. He does appear to have the strength and balance to ambulate as his respiratory status allows. Pt currently with functional limitations due to the deficits listed below (see PT Problem List).  Pt will benefit from skilled PT to increase their independence and safety with mobility to allow discharge to the venue listed below.    Initial HR 78 Initial BP 135/86 supine Initial O2 sat  88% on 15L HFNC  O2 sat with activity 82% on 15L HFNC  Post HR 76 Post BP 147/83 Post O2 sat  90% on 15L HFNC     Follow Up Recommendations SNF (or return to long-term care with no therapy needs as PTA; will continue to assess)    Equipment Recommendations  None recommended by PT    Recommendations for Other Services       Precautions / Restrictions Precautions Precautions: Fall Precaution Comments: monitor sats      Mobility  Bed Mobility Overal bed mobility: Needs Assistance Bed Mobility: Rolling;Sidelying to Sit Rolling: Min assist(vc for technique) Sidelying to sit: Min assist       General bed mobility comments: pt unsuccessfully trying to come to sit from supine with incr effort; vc for technique to side and then sit  Transfers Overall transfer level: Needs assistance Equipment  used: 2 person hand held assist(pt pushed RW away) Transfers: Sit to/from [employer] CorporationStand;Stand Pivot Transfers Sit to Stand: Min guard Stand pivot transfers: Min assist;+2 safety/equipment       General transfer comment: pt refused RW therefore +2 HHA with pt needed cues for technique and assist with lines for safety  Ambulation/Gait             General Gait Details: deferred due to respiratory status  Stairs            Wheelchair Mobility    Modified Rankin (Stroke Patients Only)       Balance Overall balance assessment: Needs assistance Sitting-balance support: No upper extremity supported;Feet supported Sitting balance-Leahy Scale: Good Sitting balance - Comments: able to lean fwd and scoot hips to EOB without LOB   Standing balance support: No upper extremity supported Standing balance-Leahy Scale: Fair Standing balance comment: bil HHA with wtshifting outside BOS                             Pertinent Vitals/Pain Pain Assessment: No/denies pain    Home Living Family/patient expects to be discharged to:: Skilled nursing facility                 Additional Comments: [facility]    Prior Function Level of Independence: Needs assistance   Gait / Transfers Assistance Needed: per pt, independent no device; per SW note,  used walker at SNF            Hand Dominance        Extremity/Trunk Assessment   Upper Extremity Assessment Upper Extremity Assessment: Defer to OT evaluation    Lower Extremity Assessment Lower Extremity Assessment: Generalized weakness    Cervical / Trunk Assessment Cervical / Trunk Assessment: Kyphotic  Communication   Communication: HOH(?HOH (vs difficulty hearing due to PPE/mask/shield))  Cognition Arousal/Alertness: Awake/alert Behavior During Therapy: Flat affect Overall Cognitive Status: No family/caregiver present to determine baseline cognitive functioning(impaired at  baseline per chart)                                        General Comments      Exercises     Assessment/Plan    PT Assessment Patient needs continued PT services  PT Problem List Decreased strength;Decreased activity tolerance;Decreased balance;Decreased mobility;Decreased cognition;Decreased knowledge of use of DME;Decreased safety awareness;Cardiopulmonary status limiting activity       PT Treatment Interventions DME instruction;Gait training;Functional mobility training;Therapeutic activities;Therapeutic exercise;Balance training;Cognitive remediation;Patient/family education    PT Goals (Current goals can be found in the Care Plan section)  Acute Rehab PT Goals Patient Stated Goal: agrees to regaining his strength and mobility PT Goal Formulation: Patient unable to participate in goal setting Time For Goal Achievement: 07/09/18 Potential to Achieve Goals: Good    Frequency Min 3X/week   Barriers to discharge        Co-evaluation               AM-PAC PT "6 Clicks" Mobility  Outcome Measure Help needed turning from your back to your side while in a flat bed without using bedrails?: A Little Help needed moving from lying on your back to sitting on the side of a flat bed without using bedrails?: A Lot Help needed moving to and from a bed to a chair (including a wheelchair)?: A Lot Help needed standing up from a chair using your arms (e.g., wheelchair or bedside chair)?: A Little Help needed to walk in hospital room?: A Lot Help needed climbing 3-5 steps with a railing? : Total 6 Click Score: 13    End of Session Equipment Utilized During Treatment: Gait belt;Oxygen Activity Tolerance: Treatment limited secondary to medical complications (Comment)(decr SaO2) Patient left: in chair;with call bell/phone within reach;with chair alarm set Nurse Communication: Mobility status;Other (comment)(SaO2 levels) PT Visit Diagnosis: Unsteadiness on feet  (R26.81)    Time: 4854-6270 PT Time Calculation (min) (ACUTE ONLY): 25 min   Charges:   PT Evaluation $PT Eval Moderate Complexity: 1 Mod            [employer], PT 06/25/2018, 10:07 AM

## 2018-06-25 NOTE — Progress Notes (Signed)
Updated sister Scherry Ran over the phone.

## 2018-06-25 NOTE — Progress Notes (Signed)
[facility] Department and AHF Team  ReDS Research Project   Patient #: [id number]  ReDS Measurement  Right: low quality x3   Left: 43%

## 2018-06-25 NOTE — Progress Notes (Signed)
[place] TEAM 1 - Stepdown/ICU TEAM  [name] PerchesKeith Cropley  ZOX:[id number] DOB: 05-07-1955 DOA: 06/17/2018 PCP: Patient, No Pcp Per    Brief Narrative:  63yo w/ a history of alcohol and polysubstance abuse, depression, HTN, HLD, and COVID-19 positive on 5/17 who was admitted from his SNF 5/19 with progressive hypoxemic respiratory failure in the setting of bilateral pulmonary infiltrates.  He received Actemra, steroids, and some intermittent diuresis, despite which he had progressive hypoxemia and increasing oxygen requirement. Remdesivir was started 5/22. Moved to the ICU 5/23, at risk for intubation.    Significant Events: 5/19 admit from SNF 5/23 transfer to ICU - CTa w/o PE   COVID-19 specific Treatment: Actemra 5/19 & 5/21 Steroids 5/20 > 5/24 Remdesivir 5/22 > 5/26  Subjective: Resting comfortably in bed. No complaints, though mildly confused. Not agitated. Denies cp or abdom pain.   Assessment & Plan:  Acute hypoxic respiratory failure due to COVID-19 pneumonia Appears to be stabilizing both from a pulmonary standpoint and a behavioral standpoint - continues to require high level oxygen support via HFNC - mobilize - gently diurese - titrate O2 as able    Recent Labs    06/23/18 0500 06/24/18 0206 06/25/18 0415 06/25/18 0425  DDIMER 0.62* 1.39*  --  1.47*  FERRITIN 1,039* 1,154* 730*  --   CRP <0.8 <0.8 0.8  --     Acute metabolic encephalopathy Multifactorial - hx of paranoia/psych hx - acute delirium - steroids have been stopped - continue PRN Haldol - nightly dose of Zyprexa increased - appears to be stabilizing   Hypertension BP well controlled   Hypokalemia Corrected w/ supplementation - Mg is normal   History of paranoia and polysubstance abuse Zyprexa dose increased -clonazepam discontinued due to need for intermittent IV benzo doses  HLD  DVT prophylaxis: lovenox  Code Status: FULL CODE Family Communication:  Disposition Plan: PT/OT - wean O2 - [facility] when O2 weaned low enough to allow this   Consultants:  PCCM  Antimicrobials:  none  Objective: Blood pressure 111/77, pulse 83, temperature (!) 97.4 F (36.3 C), temperature source Oral, resp. rate 17, height 5\' 6"  (1.676 m), weight 86 kg, SpO2 94 %.  Intake/Output Summary (Last 24 hours) at 06/25/2018 1314 Last data filed at 06/25/2018 0325 Gross per 24 hour  Intake 840.29 ml  Output 350 ml  Net 490.29 ml   Filed Weights   06/21/18 0540 06/23/18 0220  Weight: 85.5 kg 86 kg    Examination: General: respirations unlabored - sleepy but calm Lungs: fine crackles th/o - no wheezing  Cardiovascular: RRR - no M or rub  Abdomen: NT/ND, soft, bs+, no mass  Extremities: trace edema B LE w/o change   CBC: Recent Labs  Lab 06/23/18 0500 06/24/18 0206 06/25/18 0425  WBC 11.9* 9.0 7.0  NEUTROABS 9.3* 6.7 5.0  HGB 13.8 15.2 13.9  HCT 42.2 45.5 43.0  MCV 87.7 87.3 89.6  PLT 341 363 313   Basic Metabolic Panel: Recent Labs  Lab 06/21/18 0345  06/22/18 0505 06/23/18 0500 06/24/18 0206 06/25/18 0425  NA 145   < > 143 146* 142 144  K 4.2   < > 3.5 3.5 2.8* 3.9  CL 105  --  106 108 106 108  CO2 25  --  27 27 24 28   GLUCOSE 155*  --  144* 118* 95 99  BUN 23  --  20 29* 26* 21  CREATININE 0.97  --  0.79  0.94 0.88 0.84  CALCIUM 9.2  --  8.8* 8.5* 8.2* 8.4*  MG 2.3  --  2.2  --   --  2.2   < > = values in this interval not displayed.   GFR: Estimated Creatinine Clearance: 90.2 mL/min (by C-G formula based on SCr of 0.84 mg/dL).  Liver Function Tests: Recent Labs  Lab 06/22/18 0505 06/23/18 0500 06/24/18 0206 06/25/18 0425  AST 49* 82* 120* 53*  ALT 32 59* 112* 74*  ALKPHOS 45 38 47 38  BILITOT 0.3 0.7 0.8 0.9  PROT 6.7 6.2* 6.1* 5.6*  ALBUMIN 3.4* 3.1* 3.2* 3.0*    Recent Results (from the past 240 hour(s))  Blood Culture (routine x 2)     Status: None   Collection Time: 06/17/18  1:27 PM  Result Value Ref Range Status   Specimen Description  BLOOD BLOOD LEFT ARM  Final   Special Requests   Final    BOTTLES DRAWN AEROBIC AND ANAEROBIC Blood Culture adequate volume   Culture   Final    NO GROWTH 5 DAYS Performed at [facility], [address] [place], [place] [address]    Report Status 06/22/2018 FINAL  Final  Blood Culture (routine x 2)     Status: None   Collection Time: 06/17/18  1:28 PM  Result Value Ref Range Status   Specimen Description BLOOD LEFT ANTECUBITAL  Final   Special Requests   Final    BOTTLES DRAWN AEROBIC AND ANAEROBIC Blood Culture adequate volume   Culture   Final    NO GROWTH 5 DAYS Performed at [facility], [address] [place], [place] [address]    Report Status 06/22/2018 FINAL  Final  MRSA PCR Screening     Status: None   Collection Time: 06/24/18  4:19 PM  Result Value Ref Range Status   MRSA by PCR NEGATIVE NEGATIVE Final    Comment:        The GeneXpert MRSA Assay (FDA approved for NASAL specimens only), is one component of a comprehensive MRSA colonization surveillance program. It is not intended to diagnose MRSA infection nor to guide or monitor treatment for MRSA infections. Performed at [facility], [address] [place], [place] [address]      Scheduled Meds:  aspirin EC  81 mg Oral Daily   Chlorhexidine Gluconate Cloth  6 each Topical Q0600   enoxaparin (LOVENOX) injection  40 mg Subcutaneous BID   folic acid  1 mg Oral Daily   multivitamin with minerals  1 tablet Oral Daily   OLANZapine  7.5 mg Oral QHS   pantoprazole  40 mg Oral Daily   sodium chloride flush  3 mL Intravenous Q12H   thiamine  100 mg Oral Daily   vitamin C  500 mg Oral Daily   zinc sulfate  220 mg Oral Daily     LOS: 8 days   Lonia Blood, MD [facility] Office  [phone] Pager - Text Page per Loretha Stapler  If 7PM-7AM, please contact night-coverage per Amion 06/25/2018, 1:14 PM

## 2018-06-26 LAB — COMPREHENSIVE METABOLIC PANEL
ALT: 58 U/L — ABNORMAL HIGH (ref 0–44)
AST: 40 U/L (ref 15–41)
Albumin: 3.2 g/dL — ABNORMAL LOW (ref 3.5–5.0)
Alkaline Phosphatase: 43 U/L (ref 38–126)
Anion gap: 9 (ref 5–15)
BUN: 20 mg/dL (ref 8–23)
CO2: 27 mmol/L (ref 22–32)
Calcium: 8.8 mg/dL — ABNORMAL LOW (ref 8.9–10.3)
Chloride: 107 mmol/L (ref 98–111)
Creatinine, Ser: 0.82 mg/dL (ref 0.61–1.24)
GFR calc Af Amer: >60 mL/min (ref >60)
GFR calc non Af Amer: >60 mL/min (ref >60)
Glucose, Bld: 92 mg/dL (ref 70–99)
Potassium: 4.1 mmol/L (ref 3.5–5.1)
Sodium: 143 mmol/L (ref 135–145)
Total Bilirubin: 0.8 mg/dL (ref 0.3–1.2)
Total Protein: 5.8 g/dL — ABNORMAL LOW (ref 6.5–8.1)

## 2018-06-26 LAB — CBC WITH DIFFERENTIAL/PLATELET
Abs Immature Granulocytes: 0.22 K/uL — ABNORMAL HIGH (ref 0.00–0.07)
Basophils Absolute: 0 K/uL (ref 0.0–0.1)
Basophils Relative: 0 %
Eosinophils Absolute: 0.1 K/uL (ref 0.0–0.5)
Eosinophils Relative: 2 %
HCT: 43.3 % (ref 39.0–52.0)
Hemoglobin: 14.6 g/dL (ref 13.0–17.0)
Immature Granulocytes: 3 %
Lymphocytes Relative: 21 %
Lymphs Abs: 1.5 K/uL (ref 0.7–4.0)
MCH: 30.2 pg (ref 26.0–34.0)
MCHC: 33.7 g/dL (ref 30.0–36.0)
MCV: 89.6 fL (ref 80.0–100.0)
Monocytes Absolute: 0.4 K/uL (ref 0.1–1.0)
Monocytes Relative: 5 %
Neutro Abs: 5.1 K/uL (ref 1.7–7.7)
Neutrophils Relative %: 69 %
Platelets: 316 K/uL (ref 150–400)
RBC: 4.83 MIL/uL (ref 4.22–5.81)
RDW: 14.1 % (ref 11.5–15.5)
WBC: 7.4 K/uL (ref 4.0–10.5)
nRBC: 0 % (ref 0.0–0.2)

## 2018-06-26 LAB — GLUCOSE, CAPILLARY: Glucose-Capillary: 92 mg/dL (ref 70–99)

## 2018-06-26 MED ORDER — ORAL CARE MOUTH RINSE
15.0000 mL | Freq: Two times a day (BID) | OROMUCOSAL | Status: DC
  Administered 2018-06-26 – 2018-06-30 (×5): 15 mL via OROMUCOSAL

## 2018-06-26 NOTE — Progress Notes (Signed)
Physical Therapy Treatment Patient Details Name: [name] PerchesKeith [name] MRN: [id number] DOB: [birth year] Today's Date: 06/26/2018    History of Present Illness 63yo w/ a history of alcohol and polysubstance abuse, depression, HTN, HLD, and COVID-19 positive on 5/17 who was admitted fromhisSNF 5/19 with progressive hypoxemic respiratory failure in the setting of bilateral pulmonary infiltrates. COVIC +  To ICU however not intubated. CT chest negative for PE    PT Comments    Patient remains on 15L HFNC with SaO2 88-90% supine at beginning of session. Improved to 92% with sit EOB. Patient able to stand and walk short distance to the chair with SaO2>90% throughout. After rest, stood and did marching x 60 seconds with SaO2 improving to 94%. RN in to room at end of session and stated she would begin to try to wean down his O2 level. Patient is progressing towards goals and may not need skilled PT on return to his long-term care facility.     Follow Up Recommendations  SNF (vs return to long-term care with no therapy needs )     Equipment Recommendations  None recommended by PT    Recommendations for Other Services       Precautions / Restrictions Precautions Precautions: Fall Precaution Comments: monitor sats    Mobility  Bed Mobility Overal bed mobility: Needs Assistance Bed Mobility: Rolling;Sidelying to Sit Rolling: Supervision(vc for technique) Sidelying to sit: Min assist       General bed mobility comments: pt again attempting supine to sit and too weak to pull himself up; with cues moved through sidelying  Transfers Overall transfer level: Needs assistance Equipment used: 1 person hand held assist(pt pushed RW away) Transfers: Sit to/from Stand Sit to Stand: Min guard         General transfer comment: pt refused RW   Ambulation/Gait Ambulation/Gait assistance: Min guard Gait Distance (Feet): 4 Feet Assistive device: 1 person hand held assist Gait Pattern/deviations:  Step-to pattern;Decreased step length - right;Decreased step length - left     General Gait Details: short shuffling steps; limited by length of HFNC tubing   Stairs             Wheelchair Mobility    Modified Rankin (Stroke Patients Only)       Balance Overall balance assessment: Needs assistance Sitting-balance support: No upper extremity supported;Feet supported Sitting balance-Leahy Scale: Good Sitting balance - Comments: able to lean fwd and scoot hips to EOB without LOB   Standing balance support: No upper extremity supported Standing balance-Leahy Scale: Fair Standing balance comment: standing marching, wt-shfiting with no LOB (very low marches)                            Cognition Arousal/Alertness: Awake/alert Behavior During Therapy: WFL for tasks assessed/performed Overall Cognitive Status: No family/caregiver present to determine baseline cognitive functioning(impaired at baseline per chart)                                 General Comments: follows commands with some delay      Exercises Low Level/ICU Exercises Stabilized Bridging: Strengthening;Both;5 reps(rest between with 3 pursed lip breaths with cues)    General Comments General comments (skin integrity, edema, etc.): improved tolerance for upright activity this date; could benefit from +2 follow with chair to incr ambulation distance      Pertinent Vitals/Pain      Home  Living                      Prior Function            PT Goals (current goals can now be found in the care plan section) Acute Rehab PT Goals Patient Stated Goal: agrees to regaining his strength and mobility Time For Goal Achievement: 07/09/18 Progress towards PT goals: Progressing toward goals    Frequency    Min 3X/week      PT Plan Current plan remains appropriate    Co-evaluation              AM-PAC PT "6 Clicks" Mobility   Outcome Measure  Help needed turning  from your back to your side while in a flat bed without using bedrails?: A Little Help needed moving from lying on your back to sitting on the side of a flat bed without using bedrails?: A Little Help needed moving to and from a bed to a chair (including a wheelchair)?: A Little Help needed standing up from a chair using your arms (e.g., wheelchair or bedside chair)?: A Little Help needed to walk in hospital room?: A Lot Help needed climbing 3-5 steps with a railing? : Total 6 Click Score: 15    End of Session Equipment Utilized During Treatment: Gait belt;Oxygen Activity Tolerance: Patient tolerated treatment well Patient left: in chair;with call bell/phone within reach;with chair alarm set;with nursing/sitter in room Nurse Communication: Mobility status PT Visit Diagnosis: Unsteadiness on feet (R26.81)     Time: 0973-5329 PT Time Calculation (min) (ACUTE ONLY): 29 min  Charges:  $Therapeutic Activity: 23-37 mins                        [employer], PT 06/26/2018, 1:43 PM

## 2018-06-26 NOTE — Progress Notes (Signed)
PROGRESS NOTE  [name] PerchesKeith [name]  WUJ:[id number] DOB: [birth year] DOA: 06/17/2018 PCP: Patient, No Pcp Per  Brief Narrative: [name] [name] is a 63 y.o. male with a history of alcohol and polysubstance abuse, HTN, HLD, and covid-19 positive on 5/17 who [facility]. [facility], started on actemra, steroids and given diuretic without significant improvement. The patient has continued to experience progressive hypoxia despite also receiving remdesivir, was transferred to the ICU but subsequently back to the medical floor when condition stabilized.  Significant Events: 5/19 admit from SNF 5/23 transfer to ICU - CTa w/o PE   COVID-19 specific Treatment: Actemra 5/19 & 5/21 Steroids 5/20 > 5/24 Remdesivir 5/22 > 5/26  Assessment & Plan: Principal Problem:   Acute respiratory failure with hypoxia (HCC) Active Problems:   Altered mental status   Alcohol abuse   COVID-19 virus infection   Bilateral pneumonia   Encephalopathy due to COVID-19 virus  Acute hypoxic respiratory failure due to covid-19 pneumonia:  - Continue PT efforts to improve strength, pulmonary and muscular reconditioning, and delirium.  - Continue airborne, contact precautions. PPE including surgical gown, gloves, face shield, cap, shoe covers, and N-95 used during this encounter in a negative pressure room.  - Check daily labs: CBC w/diff, CMP, d-dimer, fibrinogen, ferritin, LDH, CRP - Maintain net negative.  - Avoid NSAIDs - Recommend proning and aggressive use of incentive spirometry. - Goals of care were discussed. Prognosis is guarded.   Delirium seems to be improving but confabulating significantly during encounter today.   Obesity: BMI 30.6. Noted  Acute metabolic encephalopathy Multifactorial - hx of paranoia/psych hx - acute delirium - steroids have been stopped - continue PRN Haldol - nightly dose of Zyprexa increased - appears to be stabilizing   Hypertension BP well  controlled   Hypokalemia Corrected w/ supplementation - Mg is normal   History of paranoia and polysubstance abuse Zyprexa dose increased -clonazepam discontinued due to need for intermittent IV benzo doses  HLD  DVT prophylaxis: Lovenox Code Status: Full Family Communication: None at bedside Disposition Plan: Anticipate DC back to NH once hypoxia improved.  Consultants:   PCCM  Procedures:   None  Antimicrobials:  None   Subjective: 82% on 15L with activity yesterday with PT. Getting up to chair more easily, however. States he lives alone PTA but [facility]. When asked who I need to update, he suggests his mother who he says is [id number] years old.   Objective: Vitals:   06/26/18 0400 06/26/18 0740 06/26/18 0915 06/26/18 0927  BP: 127/66  117/73   Pulse: 60     Resp: 19     Temp: 97.8 F (36.6 C) 97.7 F (36.5 C) 98.5 F (36.9 C)   TempSrc: Oral Oral Axillary   SpO2: 95%   96%  Weight:      Height:        Intake/Output Summary (Last 24 hours) at 06/26/2018 0928 Last data filed at 06/26/2018 0200 Gross per 24 hour  Intake 100 ml  Output 850 ml  Net -750 ml   Filed Weights   06/21/18 0540 06/23/18 0220  Weight: 85.5 kg 86 kg    Gen: 63 y.o. male in no distress  Pulm: Non-labored, 92% on 12L by HFNC this AM at rest. CV: Regular rate and rhythm. No murmur, rub, or gallop. No JVD, no pedal edema. GI: Abdomen soft, non-tender, non-distended, with normoactive bowel sounds. No organomegaly or masses  felt. Ext: Warm, no deformities Skin: No rashes, lesions no ulcers Neuro: Alert, conversant but not oriented. No focal neurological deficits. Psych: Judgement and insight appear impaired. Mood & affect appropriate.   Data Reviewed: I have personally reviewed following labs and imaging studies  CBC: Recent Labs  Lab 06/22/18 0505 06/23/18 0500 06/24/18 0206 06/25/18 0425 06/26/18 0345  WBC 14.8* 11.9* 9.0 7.0 7.4  NEUTROABS 11.8* 9.3*  6.7 5.0 5.1  HGB 14.2 13.8 15.2 13.9 14.6  HCT 41.7 42.2 45.5 43.0 43.3  MCV 87.2 87.7 87.3 89.6 89.6  PLT 349 341 363 313 316   Basic Metabolic Panel: Recent Labs  Lab 06/20/18 0742 06/21/18 0345  06/22/18 0505 06/23/18 0500 06/24/18 0206 06/25/18 0425 06/26/18 0345  NA 140 145   < > 143 146* 142 144 143  K 3.3* 4.2   < > 3.5 3.5 2.8* 3.9 4.1  CL 105 105  --  106 108 106 108 107  CO2 23 25  --  GLUCOSE 174* 155*  --  144* 118* 95 99 92  BUN 28* 23  --  20 29* 26* 21 20  CREATININE 0.94 0.97  --  0.79 0.94 0.88 0.84 0.82  CALCIUM 8.5* 9.2  --  8.8* 8.5* 8.2* 8.4* 8.8*  MG 2.1 2.3  --  2.2  --   --  2.2  --    < > = values in this interval not displayed.   GFR: Estimated Creatinine Clearance: 92.4 mL/min (by C-G formula based on SCr of 0.82 mg/dL). Liver Function Tests: Recent Labs  Lab 06/22/18 0505 06/23/18 0500 06/24/18 0206 06/25/18 0425 06/26/18 0345  AST 49* 82* 120* 53* 40  ALT 32 59* 112* 74* 58*  ALKPHOS 45 38 47 38 43  BILITOT 0.3 0.7 0.8 0.9 0.8  PROT 6.7 6.2* 6.1* 5.6* 5.8*  ALBUMIN 3.4* 3.1* 3.2* 3.0* 3.2*   No results for input(s): LIPASE, AMYLASE in the last 168 hours. No results for input(s): AMMONIA in the last 168 hours. Coagulation Profile: No results for input(s): INR, PROTIME in the last 168 hours. Cardiac Enzymes: No results for input(s): CKTOTAL, CKMB, CKMBINDEX, TROPONINI in the last 168 hours. BNP (last 3 results) No results for input(s): PROBNP in the last 8760 hours. HbA1C: No results for input(s): HGBA1C in the last 72 hours. CBG: No results for input(s): GLUCAP in the last 168 hours. Lipid Profile: No results for input(s): CHOL, HDL, LDLCALC, TRIG, CHOLHDL, LDLDIRECT in the last 72 hours. Thyroid Function Tests: No results for input(s): TSH, T4TOTAL, FREET4, T3FREE, THYROIDAB in the last 72 hours. Anemia Panel: Recent Labs    06/24/18 0206 06/25/18 0415  FERRITIN 1,154* 730*   Urine analysis:    Component  Value Date/Time   COLORURINE YELLOW 05/18/2013 1909   APPEARANCEUR CLEAR 05/18/2013 1909   LABSPEC 1.004 (L) 05/18/2013 1909   PHURINE 7.0 05/18/2013 1909   GLUCOSEU NEGATIVE 05/18/2013 1909   HGBUR NEGATIVE 05/18/2013 1909   BILIRUBINUR NEGATIVE 05/18/2013 1909   KETONESUR NEGATIVE 05/18/2013 1909   PROTEINUR NEGATIVE 05/18/2013 1909   UROBILINOGEN 0.2 05/18/2013 1909   NITRITE NEGATIVE 05/18/2013 1909   LEUKOCYTESUR NEGATIVE 05/18/2013 1909   Recent Results (from the past 240 hour(s))  Blood Culture (routine x 2)     Status: None   Collection Time: 06/17/18  1:27 PM  Result Value Ref Range Status   Specimen Description BLOOD BLOOD LEFT ARM  Final   Special Requests  Final    BOTTLES DRAWN AEROBIC AND ANAEROBIC Blood Culture adequate volume   Culture   Final    NO GROWTH 5 DAYS Performed at [facility], [address] [place], [place] [address]    Report Status 06/22/2018 FINAL  Final  Blood Culture (routine x 2)     Status: None   Collection Time: 06/17/18  1:28 PM  Result Value Ref Range Status   Specimen Description BLOOD LEFT ANTECUBITAL  Final   Special Requests   Final    BOTTLES DRAWN AEROBIC AND ANAEROBIC Blood Culture adequate volume   Culture   Final    NO GROWTH 5 DAYS Performed at [facility], [address] [place], [place] [address]    Report Status 06/22/2018 FINAL  Final  MRSA PCR Screening     Status: None   Collection Time: 06/24/18  4:19 PM  Result Value Ref Range Status   MRSA by PCR NEGATIVE NEGATIVE Final    Comment:        The GeneXpert MRSA Assay (FDA approved for NASAL specimens only), is one component of a comprehensive MRSA colonization surveillance program. It is not intended to diagnose MRSA infection nor to guide or monitor treatment for MRSA infections. Performed at [facility], [address], [place], [place] [address]       Radiology Studies: Dg Chest Port 1 View  Result Date:  06/25/2018 CLINICAL DATA:  63 year old male with history of acute respiratory disease secondary to COVID-19. EXAM: PORTABLE CHEST 1 VIEW COMPARISON:  Chest x-ray 06/23/2018. FINDINGS: Lung volumes are low. There continues to be patchy multifocal interstitial prominence and airspace consolidation asymmetrically distributed throughout the lungs bilaterally, most confluent in the left mid lung and in the right upper lobe, with worsening aeration compared to the prior study from 06/23/2018. Possible small left pleural effusion. No right pleural effusion. No pneumothorax. No evidence of pulmonary edema. Heart size is normal. Upper mediastinal contours are within normal limits. Aortic atherosclerosis. IMPRESSION: 1. Worsening multilobar pneumonia, as above. 2. Possible small left pleural effusion. 3. Aortic atherosclerosis. Electronically Signed   By: Trudie Reed M.D.   On: 06/25/2018 08:00    Scheduled Meds: . aspirin EC  81 mg Oral Daily  . Chlorhexidine Gluconate Cloth  6 each Topical Q0600  . enoxaparin (LOVENOX) injection  40 mg Subcutaneous BID  . folic acid  1 mg Oral Daily  . multivitamin with minerals  1 tablet Oral Daily  . OLANZapine  7.5 mg Oral QHS  . pantoprazole  40 mg Oral Daily  . sodium chloride flush  3 mL Intravenous Q12H  . thiamine  100 mg Oral Daily  . vitamin C  500 mg Oral Daily  . zinc sulfate  220 mg Oral Daily   Continuous Infusions: . sodium chloride Stopped (06/24/18 1543)     LOS: 9 days   Time spent: 35 minutes.  Tyrone Nine, MD [facility] [email or web address] Password [facility] 06/26/2018, 9:28 AM

## 2018-06-26 NOTE — Progress Notes (Signed)
CardioVascular Research Department and AHF Team  ReDS Research Project   Patient #: [id number]  ReDS Measurement  Right: 54 %  Left: 61 %

## 2018-06-27 LAB — COMPREHENSIVE METABOLIC PANEL
ALT: 64 U/L — ABNORMAL HIGH (ref 0–44)
AST: 46 U/L — ABNORMAL HIGH (ref 15–41)
Albumin: 3.3 g/dL — ABNORMAL LOW (ref 3.5–5.0)
Alkaline Phosphatase: 41 U/L (ref 38–126)
Anion gap: 8 (ref 5–15)
BUN: 13 mg/dL (ref 8–23)
CO2: 29 mmol/L (ref 22–32)
Calcium: 8.8 mg/dL — ABNORMAL LOW (ref 8.9–10.3)
Chloride: 104 mmol/L (ref 98–111)
Creatinine, Ser: 0.83 mg/dL (ref 0.61–1.24)
GFR calc Af Amer: >60 mL/min (ref >60)
GFR calc non Af Amer: >60 mL/min (ref >60)
Glucose, Bld: 71 mg/dL (ref 70–99)
Potassium: 4.2 mmol/L (ref 3.5–5.1)
Sodium: 141 mmol/L (ref 135–145)
Total Bilirubin: 0.9 mg/dL (ref 0.3–1.2)
Total Protein: 5.9 g/dL — ABNORMAL LOW (ref 6.5–8.1)

## 2018-06-27 LAB — CBC WITH DIFFERENTIAL/PLATELET
Abs Immature Granulocytes: 0.12 10*3/uL — ABNORMAL HIGH (ref 0.00–0.07)
Basophils Absolute: 0 10*3/uL (ref 0.0–0.1)
Basophils Relative: 1 %
Eosinophils Absolute: 0.1 10*3/uL (ref 0.0–0.5)
Eosinophils Relative: 1 %
HCT: 44.8 % (ref 39.0–52.0)
Hemoglobin: 14.9 g/dL (ref 13.0–17.0)
Immature Granulocytes: 2 %
Lymphocytes Relative: 25 %
Lymphs Abs: 1.6 10*3/uL (ref 0.7–4.0)
MCH: 30 pg (ref 26.0–34.0)
MCHC: 33.3 g/dL (ref 30.0–36.0)
MCV: 90.3 fL (ref 80.0–100.0)
Monocytes Absolute: 0.5 10*3/uL (ref 0.1–1.0)
Monocytes Relative: 7 %
Neutro Abs: 4 10*3/uL (ref 1.7–7.7)
Neutrophils Relative %: 64 %
Platelets: 351 10*3/uL (ref 150–400)
RBC: 4.96 MIL/uL (ref 4.22–5.81)
RDW: 14 % (ref 11.5–15.5)
WBC: 6.3 10*3/uL (ref 4.0–10.5)
nRBC: 0 % (ref 0.0–0.2)

## 2018-06-27 NOTE — Progress Notes (Signed)
PROGRESS NOTE  [name] PerchesKeith [name]  WUJ:[id number] DOB: 30-Apr-1955 DOA: 06/17/2018 PCP: Patient, No Pcp Per  Brief Narrative: [name] PerchesKeith Yuhas is a 63 y.o. male with a history of alcohol and polysubstance abuse, HTN, HLD, and covid-19 positive on 5/17 who [facility]. [facility], started on actemra, steroids and given diuretic without significant improvement. The patient has continued to experience progressive hypoxia despite also receiving remdesivir, was transferred to the ICU but subsequently back to the medical floor when condition stabilized.  Significant Events: 5/19 admit from SNF 5/23 transfer to ICU - CTa w/o PE   COVID-19 specific Treatment: Actemra 5/19 & 5/21 Steroids 5/20 > 5/24 Remdesivir 5/22 > 5/26  Assessment & Plan: Principal Problem:   Acute respiratory failure with hypoxia (HCC) Active Problems:   Altered mental status   Alcohol abuse   COVID-19 virus infection   Bilateral pneumonia   Encephalopathy due to COVID-19 virus  Acute hypoxic respiratory failure due to covid-19 pneumonia: Hypoxia is improving. - Continue PT efforts to improve strength, pulmonary and muscular reconditioning, and delirium.  - Continue airborne, contact precautions. PPE including surgical gown, gloves, face shield, cap, shoe covers, and N-95 used during this encounter in a negative pressure room.  - Check daily labs: CBC w/diff, CMP, d-dimer, fibrinogen, ferritin, LDH, CRP - Maintain net negative.  - Avoid NSAIDs - Recommend proning and aggressive use of incentive spirometry. - Goals of care were discussed. Prognosis is guarded.   Obesity: BMI 30.6. Noted  Acute metabolic encephalopathy: Multifactorial including acute delirium.  - Limit psychotropic medications, delirium precautions. - qHS zyprexa.  Hypertension: BP well controlled   Hypokalemia: Corrected w/ supplementation - Mg is normal   History of paranoia and polysubstance abuse -  Clonazepam discontinued due to need for intermittent IV benzo doses  HLD:  LFT elevation: Improving, mild. Due to viral syndrome.   DVT prophylaxis: Lovenox Code Status: Full Family Communication: None at bedside Disposition Plan: Anticipate DC back to NH once hypoxia improved.  Consultants:   PCCM  Procedures:   None  Antimicrobials:  None   Subjective: Continues to require supplemental oxygen which is not baseline. Shortness of breath is stable.  Objective: Vitals:   06/27/18 0500 06/27/18 0600 06/27/18 0800 06/27/18 0850  BP:    116/74  Pulse: 69 68 64 71  Resp: (!) 26 18 (!) 22 15  Temp:    98 F (36.7 C)  TempSrc:    Oral  SpO2: (!) 89% 90% (!) 89% 91%  Weight:      Height:        Intake/Output Summary (Last 24 hours) at 06/27/2018 1033 Last data filed at 06/27/2018 0800 Gross per 24 hour  Intake 720 ml  Output 400 ml  Net 320 ml   Filed Weights   06/21/18 0540 06/23/18 0220  Weight: 85.5 kg 86 kg   Gen: 63 y.o. male in no distress Pulm: Nonlabored tachypnea. Clear. CV: Regular rate and rhythm. No murmur, rub, or gallop. No JVD, no dependent edema. GI: Abdomen soft, non-tender, non-distended, with normoactive bowel sounds.  Ext: Warm, no deformities Skin: No rashes, lesions or ulcers on visualized skin. Neuro: Alert, disoriented. No focal neurological deficits. Psych: Judgement and insight appear impaired. Mood euthymic & affect congruent.    Data Reviewed: I have personally reviewed following labs and imaging studies  CBC: Recent Labs  Lab 06/23/18 0500 06/24/18 0206 06/25/18 0425 06/26/18 0345 06/27/18 0500  WBC 11.9* 9.0  7.0 7.4 6.3  NEUTROABS 9.3* 6.7 5.0 5.1 4.0  HGB 13.8 15.2 13.9 14.6 14.9  HCT 42.2 45.5 43.0 43.3 44.8  MCV 87.7 87.3 89.6 89.6 90.3  PLT 341 363 313 316 351   Basic Metabolic Panel: Recent Labs  Lab 06/21/18 0345  06/22/18 0505 06/23/18 0500 06/24/18 0206 06/25/18 0425 06/26/18 0345 06/27/18 0500  NA 145    < > 143 146* 142 144 143 141  K 4.2   < > 3.5 3.5 2.8* 3.9 4.1 4.2  CL 105  --  106 108 106 108 107 104  CO2 25  --  GLUCOSE 155*  --  144* 118* 95 99 92 71  BUN 23  --  20 29* 26* CREATININE 0.97  --  0.79 0.94 0.88 0.84 0.82 0.83  CALCIUM 9.2  --  8.8* 8.5* 8.2* 8.4* 8.8* 8.8*  MG 2.3  --  2.2  --   --  2.2  --   --    < > = values in this interval not displayed.   GFR: Estimated Creatinine Clearance: 91.2 mL/min (by C-G formula based on SCr of 0.83 mg/dL). Liver Function Tests: Recent Labs  Lab 06/23/18 0500 06/24/18 0206 06/25/18 0425 06/26/18 0345 06/27/18 0500  AST 82* 120* 53* 40 46*  ALT 59* 112* 74* 58* 64*  ALKPHOS 38 47 38 43 41  BILITOT 0.7 0.8 0.9 0.8 0.9  PROT 6.2* 6.1* 5.6* 5.8* 5.9*  ALBUMIN 3.1* 3.2* 3.0* 3.2* 3.3*   No results for input(s): LIPASE, AMYLASE in the last 168 hours. No results for input(s): AMMONIA in the last 168 hours. Coagulation Profile: No results for input(s): INR, PROTIME in the last 168 hours. Cardiac Enzymes: No results for input(s): CKTOTAL, CKMB, CKMBINDEX, TROPONINI in the last 168 hours. BNP (last 3 results) No results for input(s): PROBNP in the last 8760 hours. HbA1C: No results for input(s): HGBA1C in the last 72 hours. CBG: Recent Labs  Lab 06/26/18 1555  GLUCAP 92   Lipid Profile: No results for input(s): CHOL, HDL, LDLCALC, TRIG, CHOLHDL, LDLDIRECT in the last 72 hours. Thyroid Function Tests: No results for input(s): TSH, T4TOTAL, FREET4, T3FREE, THYROIDAB in the last 72 hours. Anemia Panel: Recent Labs    06/25/18 0415  FERRITIN 730*   Urine analysis:    Component Value Date/Time   COLORURINE YELLOW 05/18/2013 1909   APPEARANCEUR CLEAR 05/18/2013 1909   LABSPEC 1.004 (L) 05/18/2013 1909   PHURINE 7.0 05/18/2013 1909   GLUCOSEU NEGATIVE 05/18/2013 1909   HGBUR NEGATIVE 05/18/2013 1909   BILIRUBINUR NEGATIVE 05/18/2013 1909   KETONESUR NEGATIVE 05/18/2013 1909   PROTEINUR  NEGATIVE 05/18/2013 1909   UROBILINOGEN 0.2 05/18/2013 1909   NITRITE NEGATIVE 05/18/2013 1909   LEUKOCYTESUR NEGATIVE 05/18/2013 1909   Recent Results (from the past 240 hour(s))  Blood Culture (routine x 2)     Status: None   Collection Time: 06/17/18  1:27 PM  Result Value Ref Range Status   Specimen Description BLOOD BLOOD LEFT ARM  Final   Special Requests   Final    BOTTLES DRAWN AEROBIC AND ANAEROBIC Blood Culture adequate volume   Culture   Final    NO GROWTH 5 DAYS Performed at [facility], [address], [place], [place] [address]    Report Status 06/22/2018 FINAL  Final  Blood Culture (routine x 2)     Status: None   Collection Time:  06/17/18  1:28 PM  Result Value Ref Range Status   Specimen Description BLOOD LEFT ANTECUBITAL  Final   Special Requests   Final    BOTTLES DRAWN AEROBIC AND ANAEROBIC Blood Culture adequate volume   Culture   Final    NO GROWTH 5 DAYS Performed at [facility], [address] [place], [place] [address]    Report Status 06/22/2018 FINAL  Final  MRSA PCR Screening     Status: None   Collection Time: 06/24/18  4:19 PM  Result Value Ref Range Status   MRSA by PCR NEGATIVE NEGATIVE Final    Comment:        The GeneXpert MRSA Assay (FDA approved for NASAL specimens only), is one component of a comprehensive MRSA colonization surveillance program. It is not intended to diagnose MRSA infection nor to guide or monitor treatment for MRSA infections. Performed at [facility], [address], [place], [place] [address]       Radiology Studies: No results found.  Scheduled Meds: . aspirin EC  81 mg Oral Daily  . Chlorhexidine Gluconate Cloth  6 each Topical Q0600  . enoxaparin (LOVENOX) injection  40 mg Subcutaneous BID  . folic acid  1 mg Oral Daily  . mouth rinse  15 mL Mouth Rinse BID  . multivitamin with minerals  1 tablet Oral Daily  . OLANZapine  7.5 mg Oral QHS  . pantoprazole  40  mg Oral Daily  . sodium chloride flush  3 mL Intravenous Q12H  . thiamine  100 mg Oral Daily  . vitamin C  500 mg Oral Daily  . zinc sulfate  220 mg Oral Daily   Continuous Infusions: . sodium chloride Stopped (06/24/18 1543)     LOS: 10 days   Time spent: 35 minutes.  Tyrone Nine, MD [facility] [email or web address] Password [facility] 06/27/2018, 10:33 AM

## 2018-06-27 NOTE — Progress Notes (Signed)
CardioVascular Rewsearch Department and AHF Team  ReDS Research Project   Patient #: [id number]  ReDS Measurement  Right: 46 %  Left: 57 %

## 2018-06-28 LAB — COMPREHENSIVE METABOLIC PANEL
ALT: 64 U/L — ABNORMAL HIGH (ref 0–44)
AST: 45 U/L — ABNORMAL HIGH (ref 15–41)
Albumin: 3.4 g/dL — ABNORMAL LOW (ref 3.5–5.0)
Alkaline Phosphatase: 42 U/L (ref 38–126)
Anion gap: 8 (ref 5–15)
BUN: 12 mg/dL (ref 8–23)
CO2: 28 mmol/L (ref 22–32)
Calcium: 9 mg/dL (ref 8.9–10.3)
Chloride: 105 mmol/L (ref 98–111)
Creatinine, Ser: 0.91 mg/dL (ref 0.61–1.24)
GFR calc Af Amer: >60 mL/min (ref >60)
GFR calc non Af Amer: >60 mL/min (ref >60)
Glucose, Bld: 80 mg/dL (ref 70–99)
Potassium: 4.2 mmol/L (ref 3.5–5.1)
Sodium: 141 mmol/L (ref 135–145)
Total Bilirubin: 0.7 mg/dL (ref 0.3–1.2)
Total Protein: 6 g/dL — ABNORMAL LOW (ref 6.5–8.1)

## 2018-06-28 LAB — CBC WITH DIFFERENTIAL/PLATELET
Abs Immature Granulocytes: 0.05 10*3/uL (ref 0.00–0.07)
Basophils Absolute: 0 10*3/uL (ref 0.0–0.1)
Basophils Relative: 0 %
Eosinophils Absolute: 0.1 10*3/uL (ref 0.0–0.5)
Eosinophils Relative: 2 %
HCT: 44.1 % (ref 39.0–52.0)
Hemoglobin: 14.3 g/dL (ref 13.0–17.0)
Immature Granulocytes: 1 %
Lymphocytes Relative: 24 %
Lymphs Abs: 1.5 10*3/uL (ref 0.7–4.0)
MCH: 29.4 pg (ref 26.0–34.0)
MCHC: 32.4 g/dL (ref 30.0–36.0)
MCV: 90.6 fL (ref 80.0–100.0)
Monocytes Absolute: 0.6 10*3/uL (ref 0.1–1.0)
Monocytes Relative: 10 %
Neutro Abs: 3.9 10*3/uL (ref 1.7–7.7)
Neutrophils Relative %: 63 %
Platelets: 330 10*3/uL (ref 150–400)
RBC: 4.87 MIL/uL (ref 4.22–5.81)
RDW: 14 % (ref 11.5–15.5)
WBC: 6.1 10*3/uL (ref 4.0–10.5)
nRBC: 0 % (ref 0.0–0.2)

## 2018-06-28 MED ORDER — OLANZAPINE 10 MG PO TABS
10.0000 mg | ORAL_TABLET | Freq: Every day | ORAL | Status: DC
  Administered 2018-06-28 – 2018-06-29 (×2): 10 mg via ORAL
  Filled 2018-06-28 (×3): qty 1

## 2018-06-28 NOTE — Progress Notes (Addendum)
Patient ambulated around unit twice. 5 L O2 sat ranged 87%-91%. At rest sats 91% 2L.Marland Kitchen Notified Dr Jarvis Newcomer via epic page

## 2018-06-28 NOTE — Progress Notes (Signed)
CardioVascular Research Department and AHF Team  ReDS Research Project   Patient #: [id number]  ReDS Measurement  Right: 51 %  Left: 57 %

## 2018-06-28 NOTE — Progress Notes (Signed)
PROGRESS NOTE  [name] [name]  QVZ:[id number] DOB: [birth year] DOA: 06/17/2018 PCP: Patient, No Pcp Per  Brief Narrative: [name] [name] is a 63 y.o. male with a history of alcohol and polysubstance abuse, HTN, HLD, and covid-19 positive on 5/17 who presented from [facility] on 5/19. He was admitted to [facility], started on actemra, steroids and given diuretic without significant improvement. The patient has continued to experience progressive hypoxia despite also receiving remdesivir, was transferred to the ICU but subsequently back to the medical floor when condition stabilized.  Significant Events: 5/19 admit from SNF 5/23 transfer to ICU - CTa w/o PE   COVID-19 specific Treatment: Actemra 5/19 & 5/21 Steroids 5/20 > 5/24 Remdesivir 5/22 > 5/26  Assessment & Plan: Principal Problem:   Acute respiratory failure with hypoxia (HCC) Active Problems:   Altered mental status   Alcohol abuse   COVID-19 virus infection   Bilateral pneumonia   Encephalopathy due to COVID-19 virus  Acute hypoxic respiratory failure due to covid-19 pneumonia: Hypoxia is improving.  - Check exertional pulse oximetry.  - Inflammatory markers sustained improvement, will not continue checking daily.  - Continue PT efforts to improve strength, pulmonary and muscular reconditioning, and delirium.  - Continue airborne, contact precautions. PPE including surgical gown, gloves, face shield, cap, shoe covers, and N-95 used during this encounter in a negative pressure room.  - Maintain net negative.  - Avoid NSAIDs - Recommend proning and aggressive use of incentive spirometry. - Goals of care were discussed.   Obesity: BMI 30.6. Noted  Acute metabolic encephalopathy: Multifactorial including acute delirium.  - Limit psychotropic medications, delirium precautions. - qHS zyprexa, increase dose today. QTc durably < .    Hypertension: BP well controlled   Hypokalemia: Corrected w/ supplementation.   History of paranoia and polysubstance abuse - Clonazepam discontinued due to need for intermittent IV benzo doses  HLD:  LFT elevation: Improved and now stable, mild. Due to viral syndrome. Will not continue routinely checking  DVT prophylaxis: Lovenox Code Status: Full Family Communication: None at bedside, sister by phone today. Disposition Plan: Anticipate DC back to NH once hypoxia improved.  Consultants:   PCCM  Procedures:   None  Antimicrobials:  None   Subjective: Oxygen requirements coming down but still short of breath. Otherwise, he is confused, no palpitations or chest pain.    Objective: Vitals:   06/27/18 2051 06/28/18 0011 06/28/18 0806 06/28/18 1003  BP: 138/86 95/65 118/61   Pulse:   (!) 58   Resp:   20   Temp: (!) 97.4 F (36.3 C) 98 F (36.7 C) 98.7 F (37.1 C)   TempSrc: Oral Oral Oral   SpO2:   93% 92%  Weight:      Height:        Intake/Output Summary (Last 24 hours) at 06/28/2018 1009 Last data filed at 06/28/2018 0913 Gross per 24 hour  Intake 480 ml  Output 500 ml  Net -20 ml   Filed Weights   06/21/18 0540 06/23/18 0220  Weight: 85.5 kg 86 kg   Gen: 63 y.o. male appearing older than stated age in no distress Pulm: Nonlabored tachypnea. Clear. CV: Regular rate and rhythm. No murmur, rub, or gallop. No JVD, no dependent edema. GI: Abdomen soft, non-tender, non-distended, with normoactive bowel sounds.  Ext: Warm, no deformities Skin: No rashes, lesions or ulcers on visualized skin. Neuro: Alert, disoriented. No focal neurological deficits. Psych: Judgement and insight appear fair. Mood euthymic & affect congruent. Behavior  is appropriate.    Data Reviewed: I have personally reviewed following labs and imaging studies  CBC: Recent Labs  Lab 06/24/18 0206 06/25/18 0425 06/26/18 0345 06/27/18 0500 06/28/18 0225  WBC 9.0 7.0 7.4 6.3 6.1  NEUTROABS 6.7 5.0 5.1 4.0 3.9  HGB 15.2 13.9 14.6 14.9 14.3  HCT 45.5 43.0 43.3 44.8  44.1  MCV 87.3 89.6 89.6 90.3 90.6  PLT 363 313 316 351 330   Basic Metabolic Panel: Recent Labs  Lab 06/22/18 0505  06/24/18 0206 06/25/18 0425 06/26/18 0345 06/27/18 0500 06/28/18 0225  NA 143   < > 142 144 143 141 141  K 3.5   < > 2.8* 3.9 4.1 4.2 4.2  CL 106   < > 106 108 107 104 105  CO2 27   < > 24 28 27 29 28   GLUCOSE 144*   < > 95 99 92 71 80  BUN 20   < > 26* 21 20 13 12   CREATININE 0.79   < > 0.88 0.84 0.82 0.83 0.91  CALCIUM 8.8*   < > 8.2* 8.4* 8.8* 8.8* 9.0  MG 2.2  --   --  2.2  --   --   --    < > = values in this interval not displayed.   GFR: Estimated Creatinine Clearance: 83.2 mL/min (by C-G formula based on SCr of 0.91 mg/dL). Liver Function Tests: Recent Labs  Lab 06/24/18 0206 06/25/18 0425 06/26/18 0345 06/27/18 0500 06/28/18 0225  AST 120* 53* 40 46* 45*  ALT 112* 74* 58* 64* 64*  ALKPHOS 47 38 43 41 42  BILITOT 0.8 0.9 0.8 0.9 0.7  PROT 6.1* 5.6* 5.8* 5.9* 6.0*  ALBUMIN 3.2* 3.0* 3.2* 3.3* 3.4*   CBG: Recent Labs  Lab 06/26/18 1555  GLUCAP 92   Urine analysis:    Component Value Date/Time   COLORURINE YELLOW 05/18/2013 1909   APPEARANCEUR CLEAR 05/18/2013 1909   LABSPEC 1.004 (L) 05/18/2013 1909   PHURINE 7.0 05/18/2013 1909   GLUCOSEU NEGATIVE 05/18/2013 1909   HGBUR NEGATIVE 05/18/2013 1909   BILIRUBINUR NEGATIVE 05/18/2013 1909   KETONESUR NEGATIVE 05/18/2013 1909   PROTEINUR NEGATIVE 05/18/2013 1909   UROBILINOGEN 0.2 05/18/2013 1909   NITRITE NEGATIVE 05/18/2013 1909   LEUKOCYTESUR NEGATIVE 05/18/2013 1909   Recent Results (from the past 240 hour(s))  MRSA PCR Screening     Status: None   Collection Time: 06/24/18  4:19 PM  Result Value Ref Range Status   MRSA by PCR NEGATIVE NEGATIVE Final    Comment:        The GeneXpert MRSA Assay (FDA approved for NASAL specimens only), is one component of a comprehensive MRSA colonization surveillance program. It is not intended to diagnose MRSA infection nor to guide or  monitor treatment for MRSA infections. Performed at [facility], [address] [place], [place] [id number]       Radiology Studies: No results found.  Scheduled Meds: . aspirin EC  81 mg Oral Daily  . Chlorhexidine Gluconate Cloth  6 each Topical Q0600  . enoxaparin (LOVENOX) injection  40 mg Subcutaneous BID  . folic acid  1 mg Oral Daily  . mouth rinse  15 mL Mouth Rinse BID  . multivitamin with minerals  1 tablet Oral Daily  . OLANZapine  7.5 mg Oral QHS  . pantoprazole  40 mg Oral Daily  . sodium chloride flush  3 mL Intravenous Q12H  . thiamine  100 mg  Oral Daily  . vitamin C  500 mg Oral Daily  . zinc sulfate  220 mg Oral Daily   Continuous Infusions: . sodium chloride Stopped (06/24/18 1543)     LOS: 11 days   Time spent: 35 minutes.  Tyrone Nine, MD [facility] [email or web address] Password [email or web address] 06/28/2018, 10:09 AM

## 2018-06-29 NOTE — Progress Notes (Signed)
PROGRESS NOTE  [name] PerchesKeith [name]  RUE:[id number] DOB: [birth year] DOA: 06/17/2018 PCP: Patient, No Pcp Per  Brief Narrative: [name] PerchesKeith [name] is a 63 y.o. male with a history of alcohol and polysubstance abuse, HTN, HLD, and covid-19 positive on 5/17 who [facility]. [facility], started on actemra, steroids and given diuretic without significant improvement. The patient has continued to experience progressive hypoxia despite also receiving remdesivir, was transferred to the ICU but subsequently back to the medical floor when condition stabilized.  Significant Events: 5/19 admit from SNF 5/23 transfer to ICU - CTa w/o PE   COVID-19 specific Treatment: Actemra 5/19 & 5/21 Steroids 5/20 > 5/24 Remdesivir 5/22 > 5/26  Assessment & Plan: Principal Problem:   Acute respiratory failure with hypoxia (HCC) Active Problems:   Altered mental status   Alcohol abuse   COVID-19 virus infection   Bilateral pneumonia   Encephalopathy due to COVID-19 virus  Acute hypoxic respiratory failure due to covid-19 pneumonia: Hypoxia is improving, but remains requiring 5L O2.Marland Kitchen. Received actemra x2, received remdesivir 5/22 - 5/26. - Inflammatory markers sustained improvement, will not continue checking daily.  - Continue PT efforts to improve strength, pulmonary and muscular reconditioning, and delirium.  - Continue airborne, contact precautions. PPE including surgical gown, gloves, face shield, cap, shoe covers, and N-95 used during this encounter in a negative pressure room.  - Maintain net negative.  - Avoid NSAIDs - Aggressive incentive spirometry. - Goals of care were discussed.   Obesity: BMI 30.6. Noted  Acute metabolic encephalopathy: Multifactorial including acute delirium.  - Limit psychotropic medications, delirium precautions. - qHS zyprexa, increased dose. QTc durably < 440msec.    Hypertension: BP well controlled   Hypokalemia: Corrected w/  supplementation.  History of paranoia and polysubstance abuse - Clonazepam discontinued due to need for intermittent IV benzo doses  HLD:  LFT elevation: Improved and now stable, mild. Due to viral syndrome. Will not continue routinely checking  DVT prophylaxis: Lovenox Code Status: Full Family Communication: None at bedside, sister by phone today. Disposition Plan: Anticipate DC back to NH once hypoxia improved.  Consultants:   PCCM  Procedures:   None  Antimicrobials:  None   Subjective: Pt's exercise tolerance improving, no complaints this morning, eating well.    Objective: Vitals:   06/29/18 0047 06/29/18 0615 06/29/18 0752 06/29/18 1142  BP: 106/71 (!) 117/48 118/67 115/62  Pulse: 83 65 65   Resp: 16 18 17    Temp: 98.2 F (36.8 C) 98.2 F (36.8 C) 98.1 F (36.7 C) 98.5 F (36.9 C)  TempSrc: Oral Oral Axillary Oral  SpO2: 94% 92% 90% 91%  Weight:      Height:        Intake/Output Summary (Last 24 hours) at 06/29/2018 1223 Last data filed at 06/29/2018 1000 Gross per 24 hour  Intake 720 ml  Output 100 ml  Net 620 ml   Filed Weights   06/21/18 0540 06/23/18 0220  Weight: 85.5 kg 86 kg   Gen: 63 y.o. male appearing older than stated age in no distress Pulm: Nonlabored breathing supplemental oxygen, 88% at rest. Clear. CV: Regular rate and rhythm. No murmur, rub, or gallop. No JVD, trace dependent edema. GI: Abdomen soft, non-tender, non-distended, with normoactive bowel sounds.  Ext: Warm, no deformities Skin: No rashes, lesions or ulcers on visualized skin. Neuro: Alert, disoriented. No focal neurological deficits. Psych: Judgement and insight appear fair. Mood euthymic & affect congruent. Behavior is  appropriate.  Data Reviewed: I have personally reviewed following labs and imaging studies  CBC: Recent Labs  Lab 06/24/18 0206 06/25/18 0425 06/26/18 0345 06/27/18 0500 06/28/18 0225  WBC 9.0 7.0 7.4 6.3 6.1  NEUTROABS 6.7 5.0 5.1 4.0 3.9   HGB 15.2 13.9 14.6 14.9 14.3  HCT 45.5 43.0 43.3 44.8 44.1  MCV 87.3 89.6 89.6 90.3 90.6  PLT 363 313 316 351 330   Basic Metabolic Panel: Recent Labs  Lab 06/24/18 0206 06/25/18 0425 06/26/18 0345 06/27/18 0500 06/28/18 0225  NA 142 144 143 141 141  K 2.8* 3.9 4.1 4.2 4.2  CL 106 108 107 104 105  CO2 24 28 27 29 28   GLUCOSE 95 99 92 71 80  BUN 26* 21 20 13 12   CREATININE 0.88 0.84 0.82 0.83 0.91  CALCIUM 8.2* 8.4* 8.8* 8.8* 9.0  MG  --  2.2  --   --   --    GFR: Estimated Creatinine Clearance: 83.2 mL/min (by C-G formula based on SCr of 0.91 mg/dL). Liver Function Tests: Recent Labs  Lab 06/24/18 0206 06/25/18 0425 06/26/18 0345 06/27/18 0500 06/28/18 0225  AST 120* 53* 40 46* 45*  ALT 112* 74* 58* 64* 64*  ALKPHOS 47 38 43 41 42  BILITOT 0.8 0.9 0.8 0.9 0.7  PROT 6.1* 5.6* 5.8* 5.9* 6.0*  ALBUMIN 3.2* 3.0* 3.2* 3.3* 3.4*   CBG: Recent Labs  Lab 06/26/18 1555  GLUCAP 92   Urine analysis:    Component Value Date/Time   COLORURINE YELLOW 05/18/2013 1909   APPEARANCEUR CLEAR 05/18/2013 1909   LABSPEC 1.004 (L) 05/18/2013 1909   PHURINE 7.0 05/18/2013 1909   GLUCOSEU NEGATIVE 05/18/2013 1909   HGBUR NEGATIVE 05/18/2013 1909   BILIRUBINUR NEGATIVE 05/18/2013 1909   KETONESUR NEGATIVE 05/18/2013 1909   PROTEINUR NEGATIVE 05/18/2013 1909   UROBILINOGEN 0.2 05/18/2013 1909   NITRITE NEGATIVE 05/18/2013 1909   LEUKOCYTESUR NEGATIVE 05/18/2013 1909   Recent Results (from the past 240 hour(s))  MRSA PCR Screening     Status: None   Collection Time: 06/24/18  4:19 PM  Result Value Ref Range Status   MRSA by PCR NEGATIVE NEGATIVE Final    Comment:        The GeneXpert MRSA Assay (FDA approved for NASAL specimens only), is one component of a comprehensive MRSA colonization surveillance program. It is not intended to diagnose MRSA infection nor to guide or monitor treatment for MRSA infections. Performed at [facility], [address], [place], [place] [address]       Radiology Studies: No results found.  Scheduled Meds: . aspirin EC  81 mg Oral Daily  . Chlorhexidine Gluconate Cloth  6 each Topical Q0600  . enoxaparin (LOVENOX) injection  40 mg Subcutaneous BID  . folic acid  1 mg Oral Daily  . mouth rinse  15 mL Mouth Rinse BID  . multivitamin with minerals  1 tablet Oral Daily  . OLANZapine  10 mg Oral QHS  . pantoprazole  40 mg Oral Daily  . sodium chloride flush  3 mL Intravenous Q12H  . thiamine  100 mg Oral Daily  . vitamin C  500 mg Oral Daily  . zinc sulfate  220 mg Oral Daily   Continuous Infusions: . sodium chloride Stopped (06/24/18 1543)     LOS: 12 days   Time spent: 35 minutes.  Tyrone Nine, MD [facility] [email or web address] Password [facility] 06/29/2018, 12:23 PM

## 2018-06-29 NOTE — Progress Notes (Signed)
CardioVascular Research Department and AHF Team  ReDS Research Project   Patient #: [id number]  ReDS Measurement  Right: 50 %  Left: low quality x 3

## 2018-06-30 MED ORDER — CLONAZEPAM 0.5 MG PO TABS: 0.5000 mg | ORAL_TABLET | Freq: Two times a day (BID) | ORAL | 0 refills | Status: AC

## 2018-06-30 NOTE — Discharge Summary (Signed)
Physician Discharge Summary  Nathanuel Cabreja UEA:[id number] DOB: 08-Feb-1955 DOA: 06/17/2018  PCP: Patient, No Pcp Per  Admit date: 06/17/2018 Discharge date: 06/30/2018  Admitted From: [facility] Disposition: [facility]   Recommendations for Outpatient Follow-up:  1. Follow up with PCP in 1-2 weeks  Home Health: N/A Equipment/Devices: None new, remained >91% on room air with exertion.  Discharge Condition: Stable CODE STATUS: Full Diet recommendation: As tolerated  Brief/Interim Summary: Avantae Bither is a 63 y.o. male with a history of alcohol and polysubstance abuse, HTN, HLD, and covid-19 positive on 5/17 who presented from [facility] on 5/19. He was admitted to [facility], started on actemra, steroids and given diuretic without significant improvement. The patient has continued to experience progressive hypoxia despite also receiving remdesivir, was transferred to the ICU but subsequently back to the medical floor when condition stabilized. Supplemental oxygen has been weaned and the patient's strength has also improved. He is stable for discharge back to his facility.  Significant Events: 5/19 admit from SNF 5/23 transfer to ICU - CTa w/o PE   COVID-19 specific Treatment: Actemra 5/19 & 5/21 Steroids 5/20 > 5/24 Remdesivir 5/22 > 5/26  Discharge Diagnoses:  Principal Problem:   Acute respiratory failure with hypoxia (HCC) Active Problems:   Altered mental status   Alcohol abuse   COVID-19 virus infection   Bilateral pneumonia   Encephalopathy due to COVID-19 virus  Acute hypoxic respiratory failure due to covid-19 pneumonia: Hypoxia is improving, but remains requiring 5L O2.Marland Kitchen Received actemra x2, received remdesivir 5/22 - 5/26. - Inflammatory markers sustained improvement, will not continue checking. - Continue isolation per CDC guidelines.  - Avoid NSAIDs - Aggressive incentive spirometry.  Obesity: BMI 30.6. Noted  Acute metabolic encephalopathy:  Multifactorial including acute delirium, improved significantly. Ok to restart home medications.  Hypertension: BP well controlled  Hypokalemia: Corrected w/ supplementation.  History of paranoia and polysubstance abuse - Continue home medications  HLD: Noted. Ok to restart statin.  LFT elevation: Improved and now stable, mild. Due to viral syndrome. Will not continue routinely checking  Discharge Instructions  Allergies as of 06/30/2018   No Known Allergies     Medication List    STOP taking these medications   levofloxacin 500 MG tablet Commonly known as:  LEVAQUIN     TAKE these medications   acetaminophen 500 MG tablet Commonly known as:  TYLENOL Take 500 mg by mouth every 6 (six) hours as needed.   atorvastatin 10 MG tablet Commonly known as:  LIPITOR Take 10 mg by mouth at bedtime.   clonazePAM 0.5 MG tablet Commonly known as:  KLONOPIN Take 1 tablet (0.5 mg total) by mouth 2 (two) times daily.   multivitamin with minerals Tabs tablet Take 1 tablet by mouth daily.   OLANZapine 5 MG tablet Commonly known as:  ZYPREXA Take 5 mg by mouth at bedtime.   OLANZapine 2.5 MG tablet Commonly known as:  ZyPREXA Take 1 tablet (2.5 mg total) by mouth at bedtime.   thiamine 100 MG tablet Take 1 tablet (100 mg total) by mouth daily.      Follow-up Information    [place], [facility]. Schedule an appointment as soon as possible for a visit in 1 week(s).   Specialty:  Skilled Nursing Facility Contact information: [address] [place] [address] [phone]          No Known Allergies  Consultations:  None  Procedures/Studies: Ct Angio Chest Pe W Or Wo  Contrast  Result Date: 06/22/2018 CLINICAL DATA:  63 year old male with history of positive D-dimer. Evaluate for pulmonary embolism. Known COVID-19 infection. EXAM: CT ANGIOGRAPHY CHEST WITH CONTRAST TECHNIQUE: Multidetector CT imaging of the chest was performed using the standard  protocol during bolus administration of intravenous contrast. Multiplanar CT image reconstructions and MIPs were obtained to evaluate the vascular anatomy. CONTRAST:  OMNIPAQUE IOHEXOL 350 MG/ML SOLN COMPARISON:  None. FINDINGS: Cardiovascular: No filling defects within the pulmonary arterial tree to suggest pulmonary embolism. Heart size is normal. There is no significant pericardial fluid, thickening or pericardial calcification. There is aortic atherosclerosis, as well as atherosclerosis of the great vessels of the mediastinum and the coronary arteries, including calcified atherosclerotic plaque in the left main, left anterior descending, left circumflex and right coronary arteries. Mediastinum/Nodes: No pathologically enlarged mediastinal or hilar lymph nodes. Esophagus is unremarkable in appearance. No axillary lymphadenopathy. Lungs/Pleura: Widespread patchy areas of ground-glass attenuation, septal thickening, thickening of the peribronchovascular interstitium, regional architectural distortion and a few scattered areas of peripheral predominant consolidation are noted, most evident throughout the mid to upper lungs bilaterally. Trace amount of pleural fluid and/or thickening in the posteromedial aspect of the left hemithorax. No right pleural effusion. No definite suspicious appearing pulmonary nodules or masses. Upper Abdomen: Unremarkable. Musculoskeletal: There are no aggressive appearing lytic or blastic lesions noted in the visualized portions of the skeleton. Review of the MIP images confirms the above findings. IMPRESSION: 1. No evidence of pulmonary embolism. 2. There is a spectrum of findings in the lungs compatible with late stage pulmonary manifestations of COVID-19 infection, as described above. 3. Trace amount of left-sided pleural fluid and/or thickening. 4. Aortic atherosclerosis, in addition to left main and 3 vessel coronary artery disease. Please note that although the presence of  coronary artery calcium documents the presence of coronary artery disease, the severity of this disease and any potential stenosis cannot be assessed on this non-gated CT examination. Assessment for potential risk factor modification, dietary therapy or pharmacologic therapy may be warranted, if clinically indicated. Aortic Atherosclerosis (ICD10-I70.0). Electronically Signed   By: Trudie Reed M.D.   On: 06/22/2018 04:27   Dg Chest Port 1 View  Result Date: 06/25/2018 CLINICAL DATA:  63 year old male with history of acute respiratory disease secondary to COVID-19. EXAM: PORTABLE CHEST 1 VIEW COMPARISON:  Chest x-ray 06/23/2018. FINDINGS: Lung volumes are low. There continues to be patchy multifocal interstitial prominence and airspace consolidation asymmetrically distributed throughout the lungs bilaterally, most confluent in the left mid lung and in the right upper lobe, with worsening aeration compared to the prior study from 06/23/2018. Possible small left pleural effusion. No right pleural effusion. No pneumothorax. No evidence of pulmonary edema. Heart size is normal. Upper mediastinal contours are within normal limits. Aortic atherosclerosis. IMPRESSION: 1. Worsening multilobar pneumonia, as above. 2. Possible small left pleural effusion. 3. Aortic atherosclerosis. Electronically Signed   By: Trudie Reed M.D.   On: 06/25/2018 08:00   Dg Chest Port 1 View  Result Date: 06/23/2018 CLINICAL DATA:  Respiratory distress 63 year old male with. COVID-19 positive history of acute. EXAM: PORTABLE CHEST 1 VIEW COMPARISON:  Chest x-ray 06/21/2018. FINDINGS: Lung volumes remain low. Persistent but worsening patchy multifocal asymmetrically distributed interstitial and airspace disease, particularly confluent now in the left mid to upper lung. No definite pleural effusions. No evidence of pulmonary edema. Heart size is normal. Upper mediastinal contours are within normal limits. Aortic atherosclerosis.  IMPRESSION: 1. Worsening multilobar pneumonia, as above. 2. Aortic atherosclerosis.  Electronically Signed   By: Trudie Reedaniel  Entrikin M.D.   On: 06/23/2018 06:43   Dg Chest Port 1 View  Result Date: 06/21/2018 CLINICAL DATA:  Acute respiratory failure EXAM: PORTABLE CHEST 1 VIEW COMPARISON:  06/19/2018 FINDINGS: Bilateral airspace disease with progression in the lower lobes left greater than right. No pleural effusion. Normal vascularity. IMPRESSION: Bilateral infiltrates with progression in the lower lobes, left greater than right. No effusion. Electronically Signed   By: Marlan Palauharles  Clark M.D.   On: 06/21/2018 09:26   Dg Chest Port 1 View  Result Date: 06/17/2018 CLINICAL DATA:  Fever EXAM: PORTABLE CHEST 1 VIEW COMPARISON:  May 19, 2013 FINDINGS: There are foci of airspace consolidation in each upper and lower lobe region. There is a small left pleural effusion. Heart size and pulmonary vascularity are normal. No adenopathy. No bone lesions. IMPRESSION: Multifocal pneumonia. Small left pleural effusion. No adenopathy evident. Electronically Signed   By: Bretta BangWilliam  Woodruff III M.D.   On: 06/17/2018 14:13   Dg Chest Port 1v Same Day  Result Date: 06/19/2018 CLINICAL DATA:  Shortness of breath. EXAM: PORTABLE CHEST 1 VIEW COMPARISON:  Radiograph of Jun 17, 2018. FINDINGS: The heart size and mediastinal contours are within normal limits. No pneumothorax or pleural effusion is noted. Mildly decreased bilateral lung opacities are noted suggesting improving multifocal pneumonia. The visualized skeletal structures are unremarkable. IMPRESSION: Mildly decreased bilateral lung opacities are noted suggesting improving multifocal pneumonia. Electronically Signed   By: Lupita RaiderJames  Green Jr M.D.   On: 06/19/2018 09:14     Subjective: Feels much better, breathing more near baseline. No chest pain. Walked without hypoxia with PT.  Discharge Exam: Vitals:   06/30/18 0515 06/30/18 0835  BP: 96/61 118/79  Pulse:     Resp:    Temp: 98.4 F (36.9 C) 98 F (36.7 C)  SpO2:  92%   General: Pt is alert, awake, not in acute distress Cardiovascular: RRR, S1/S2 +, no rubs, no gallops Respiratory: CTA bilaterally, no wheezing, no rhonchi Abdominal: Soft, NT, ND, bowel sounds + Extremities: No edema, no cyanosis  Labs: BNP (last 3 results) Recent Labs    06/19/18 0407  BNP 82.6   Basic Metabolic Panel: Recent Labs  Lab 06/24/18 0206 06/25/18 0425 06/26/18 0345 06/27/18 0500 06/28/18 0225  NA 142 144 143 141 141  K 2.8* 3.9 4.1 4.2 4.2  CL 106 108 107 104 105  CO2 24 28 27 29 28   GLUCOSE 95 99 92 71 80  BUN 26* 21 20 13 12   CREATININE 0.88 0.84 0.82 0.83 0.91  CALCIUM 8.2* 8.4* 8.8* 8.8* 9.0  MG  --  2.2  --   --   --    Liver Function Tests: Recent Labs  Lab 06/24/18 0206 06/25/18 0425 06/26/18 0345 06/27/18 0500 06/28/18 0225  AST 120* 53* 40 46* 45*  ALT 112* 74* 58* 64* 64*  ALKPHOS 47 38 43 41 42  BILITOT 0.8 0.9 0.8 0.9 0.7  PROT 6.1* 5.6* 5.8* 5.9* 6.0*  ALBUMIN 3.2* 3.0* 3.2* 3.3* 3.4*   No results for input(s): LIPASE, AMYLASE in the last 168 hours. No results for input(s): AMMONIA in the last 168 hours. CBC: Recent Labs  Lab 06/24/18 0206 06/25/18 0425 06/26/18 0345 06/27/18 0500 06/28/18 0225  WBC 9.0 7.0 7.4 6.3 6.1  NEUTROABS 6.7 5.0 5.1 4.0 3.9  HGB 15.2 13.9 14.6 14.9 14.3  HCT 45.5 43.0 43.3 44.8 44.1  MCV 87.3 89.6 89.6 90.3 90.6  PLT 363  313 316 351 330   Cardiac Enzymes: No results for input(s): CKTOTAL, CKMB, CKMBINDEX, TROPONINI in the last 168 hours. BNP: Invalid input(s): POCBNP CBG: Recent Labs  Lab 06/26/18 1555  GLUCAP 92   D-Dimer No results for input(s): DDIMER in the last 72 hours. Hgb A1c No results for input(s): HGBA1C in the last 72 hours. Lipid Profile No results for input(s): CHOL, HDL, LDLCALC, TRIG, CHOLHDL, LDLDIRECT in the last 72 hours. Thyroid function studies No results for input(s): TSH, T4TOTAL, T3FREE,  THYROIDAB in the last 72 hours.  Invalid input(s): FREET3 Anemia work up No results for input(s): VITAMINB12, FOLATE, FERRITIN, TIBC, IRON, RETICCTPCT in the last 72 hours. Urinalysis    Component Value Date/Time   COLORURINE YELLOW 05/18/2013 1909   APPEARANCEUR CLEAR 05/18/2013 1909   LABSPEC 1.004 (L) 05/18/2013 1909   PHURINE 7.0 05/18/2013 1909   GLUCOSEU NEGATIVE 05/18/2013 1909   HGBUR NEGATIVE 05/18/2013 1909   BILIRUBINUR NEGATIVE 05/18/2013 1909   KETONESUR NEGATIVE 05/18/2013 1909   PROTEINUR NEGATIVE 05/18/2013 1909   UROBILINOGEN 0.2 05/18/2013 1909   NITRITE NEGATIVE 05/18/2013 1909   LEUKOCYTESUR NEGATIVE 05/18/2013 1909    Microbiology Recent Results (from the past 240 hour(s))  MRSA PCR Screening     Status: None   Collection Time: 06/24/18  4:19 PM  Result Value Ref Range Status   MRSA by PCR NEGATIVE NEGATIVE Final    Comment:        The GeneXpert MRSA Assay (FDA approved for NASAL specimens only), is one component of a comprehensive MRSA colonization surveillance program. It is not intended to diagnose MRSA infection nor to guide or monitor treatment for MRSA infections. Performed at [facility], [address], [place], [place] [address]     Time coordinating discharge: Approximately 40 minutes  Tyrone Nine, MD  [facility] 06/30/2018, 11:00 AM

## 2018-06-30 NOTE — TOC Transition Note (Addendum)
Transition of Care [facility]) - CM/SW Discharge Note   Patient Details  Name: [name] [name] MRN: [id number] Date of Birth: Sep 22, 1955  Transition of Care [facility] CM/SW Contact:  Gildardo Griffes, [place] Phone Number: [phone] 06/30/2018, 11:40 AM   Clinical Narrative:     Patient will DC to: [facility] Anticipated DC date: 06/30/2018 Family notified:Beverly Transport EN:[id number]  Per MD patient ready for DC to [facility]. RN, patient, patient's family, and facility notified of DC. Discharge Summary sent to facility. RN given number for report [phone] Room 332A. DC packet on chart. Ambulance transport requested for 2:00 pm per nurse request patient.  CSW signing off.  [place], [place] [phone]   Final next level of care: Skilled Nursing Facility Barriers to Discharge: No Barriers Identified   Patient Goals and CMS Choice   CMS Medicare.gov Compare Post Acute Care list provided to:: Patient Represenative (must comment)(Beverly (sister)) Choice offered to / list presented to : Sibling  Discharge Placement PASRR number recieved: 06/30/18            Patient chooses bed at: [facility] Patient to be transferred to facility by: [facility] Name of family member notified: Meriam Sprague Patient and family notified of of transfer: 06/30/18  Discharge Plan and Services     Post Acute Care Choice: Skilled Nursing Facility                               Social Determinants of Health (SDOH) Interventions     Readmission Risk Interventions No flowsheet data found.

## 2018-06-30 NOTE — Progress Notes (Signed)
Physical Therapy Treatment Patient Details Name: [name] PerchesKeith [name] MRN: [id number] DOB: 22-Sep-1955 Today's Date: 06/30/2018    History of Present Illness 63yo w/ a history of alcohol and polysubstance abuse, depression, HTN, HLD, and COVID-19 positive on 5/17 who was admitted fromhisSNF 5/19 with progressive hypoxemic respiratory failure in the setting of bilateral pulmonary infiltrates. COVIC +  To ICU however not intubated. CT chest negative for PE    PT Comments    Patient mobilizing better with improved step length (less shuffling), balance, and SaO2 >=91% while walking on RA. Pt with bed and chair alarm, however nursing reports he frequently gets up alone. Due to environmental barriers (rolling bedside table), he has a higher fall risk. [facility] where he likely is allowed to walk unassisted.     Follow Up Recommendations  SNF(return to long-term care with no therapy needs)     Equipment Recommendations  None recommended by PT    Recommendations for Other Services       Precautions / Restrictions Precautions Precautions: Fall Precaution Comments: monitor sats    Mobility  Bed Mobility Overal bed mobility: Independent Bed Mobility: Rolling;Sidelying to Sit;Sit to Supine Rolling: Independent(vc for technique) Sidelying to sit: Modified independent (Device/Increase time) Supine to sit: Modified independent (Device/Increase time)     General bed mobility comments: pt required incr time, HOB flat no rail  Transfers Overall transfer level: Needs assistance Equipment used: None Transfers: Sit to/from Stand Sit to Stand: Supervision(due to decr cognition only)            Ambulation/Gait Ambulation/Gait assistance: Supervision Gait Distance (Feet): 68 Feet Assistive device: None Gait Pattern/deviations: Step-through pattern     General Gait Details: step length improved; refused to wear mask and walk in the hall; pt negotiated into  bathroom without assist, urinated in standing, and returned to bed safely   Stairs             Wheelchair Mobility    Modified Rankin (Stroke Patients Only)       Balance Overall balance assessment: No apparent balance deficits (not formally assessed) Sitting-balance support: No upper extremity supported;Feet supported Sitting balance-Leahy Scale: Good     Standing balance support: No upper extremity supported Standing balance-Leahy Scale: Good Standing balance comment: standing feet together x 30 sec; feet apart EC x 10 sec (no imbalance and pt opens eyes at 10 sec); sidestepping, turns no imbalance                            Cognition Arousal/Alertness: Awake/alert Behavior During Therapy: WFL for tasks assessed/performed Overall Cognitive Status: No family/caregiver present to determine baseline cognitive functioning(impaired at baseline per chart)                                 General Comments: follows commands with some delay      Exercises      General Comments        Pertinent Vitals/Pain Pain Assessment: No/denies pain    Home Living                      Prior Function            PT Goals (current goals can now be found in the care plan section) Acute Rehab PT Goals Patient Stated Goal: agrees to regaining his strength and  mobility Time For Goal Achievement: 07/09/18 Progress towards PT goals: Progressing toward goals    Frequency    Min 3X/week      PT Plan Current plan remains appropriate    Co-evaluation              AM-PAC PT "6 Clicks" Mobility   Outcome Measure  Help needed turning from your back to your side while in a flat bed without using bedrails?: None Help needed moving from lying on your back to sitting on the side of a flat bed without using bedrails?: None Help needed moving to and from a bed to a chair (including a wheelchair)?: None Help needed standing up from a chair  using your arms (e.g., wheelchair or bedside chair)?: None Help needed to walk in hospital room?: A Little Help needed climbing 3-5 steps with a railing? : A Little 6 Click Score: 22    End of Session   Activity Tolerance: Patient tolerated treatment well Patient left: with call bell/phone within reach;in bed;with bed alarm set Nurse Communication: Mobility status PT Visit Diagnosis: Unsteadiness on feet (R26.81)     Time: 3267-1245 PT Time Calculation (min) (ACUTE ONLY): 31 min  Charges:  $Gait Training: 8-22 mins $Therapeutic Activity: 8-22 mins                        [employer], PT 06/30/2018, 10:03 AM

## 2018-06-30 NOTE — [place] FL2 (Signed)
[place] MEDICAID FL2 LEVEL OF CARE SCREENING TOOL     IDENTIFICATION  Patient Name: [name] [name] Birthdate: 1955/09/19 Sex: male Admission Date (Current Location): 06/17/2018  [facility] and [place] Number:  Chiropodist and Address:  [facility] [place], [address], [place], [place] [address]([place])      Provider Number:    Attending Physician Name and Address:  Tyrone Nine, MD  Relative Name and Phone Number:  Meriam Sprague (sister) [phone]    Current Level of Care: Hospital Recommended Level of Care: Skilled Nursing Facility Prior Approval Number:    Date Approved/Denied:   PASRR Number: [id number]  Discharge Plan: SNF    Current Diagnoses: Patient Active Problem List   Diagnosis Date Noted  . COVID-19 virus infection 06/17/2018  . Bilateral pneumonia 06/17/2018  . Acute respiratory failure with hypoxia (HCC) 06/17/2018  . Encephalopathy due to COVID-19 virus 06/17/2018  . Altered mental status 05/21/2013  . Alcohol abuse 05/21/2013  . Tobacco abuse 05/21/2013  . Hyponatremia 05/20/2013    Orientation RESPIRATION BLADDER Height & Weight     Self, Place  O2(nasal cannula 2L/min) Continent Weight: 189 lb 9.5 oz (86 kg) Height:  5\' 6"  (167.6 cm)  BEHAVIORAL SYMPTOMS/MOOD NEUROLOGICAL BOWEL NUTRITION STATUS      Continent Diet(see discharge summary)  AMBULATORY STATUS COMMUNICATION OF NEEDS Skin   Limited Assist Verbally Other (Comment)(ecchymosis arm, skin tear left buttocks)                       Personal Care Assistance Level of Assistance  Bathing, Feeding, Dressing, Total care Bathing Assistance: Limited assistance Feeding assistance: Independent Dressing Assistance: Limited assistance Total Care Assistance: Limited assistance   Functional Limitations Info  Hearing, Speech, Sight Sight Info: Adequate Hearing Info: Adequate Speech Info: Adequate    SPECIAL CARE FACTORS FREQUENCY  PT (By licensed PT), OT  (By licensed OT)     PT Frequency: min 5x weekly OT Frequency: min 5x weekly            Contractures Contractures Info: Not present    Additional Factors Info  Code Status, Allergies, Isolation Precautions Code Status Info: Full Allergies Info: No known allergies     Isolation Precautions Info: emerging pathogen, air and contact precaution     Current Medications (06/30/2018):  This is the current hospital active medication list Current Facility-Administered Medications  Medication Dose Route Frequency Provider Last Rate Last Dose  . 0.9 %  sodium chloride infusion  250 mL Intravenous PRN Haydee Monica, MD   Stopped at 06/24/18 1543  . acetaminophen (TYLENOL) tablet 650 mg  650 mg Oral Q6H PRN Haydee Monica, MD   650 mg at 06/30/18 0939  . aspirin EC tablet 81 mg  81 mg Oral Daily Tarry Kos A, MD   81 mg at 06/30/18 0951  . Chlorhexidine Gluconate Cloth 2 % PADS 6 each  6 each Topical Q0600 Lonia Blood, MD   6 each at 06/30/18 [phone]  . chlorpheniramine-HYDROcodone (TUSSIONEX) 10-8 MG/5ML suspension 5 mL  5 mL Oral Q12H PRN Tarry Kos A, MD      . enoxaparin (LOVENOX) injection 40 mg  40 mg Subcutaneous BID Maretta Bees, MD   40 mg at 06/30/18 0951  . folic acid (FOLVITE) tablet 1 mg  1 mg Oral Daily Leroy Sea, MD   1 mg at 06/30/18 0951  . guaiFENesin-dextromethorphan (ROBITUSSIN DM) 100-10 MG/5ML syrup 10  mL  10 mL Oral Q4H PRN Tarry Kosavid, Rachal A, MD   10 mL at 06/24/18 2137  . haloperidol lactate (HALDOL) injection 2-5 mg  2-5 mg Intravenous Q6H PRN Lonia BloodMcClung, Jeffrey T, MD   5 mg at 06/24/18 1831  . LORazepam (ATIVAN) injection 1-2 mg  1-2 mg Intravenous Q4H PRN Lonia BloodMcClung, Jeffrey T, MD   2 mg at 06/25/18 1629  . MEDLINE mouth rinse  15 mL Mouth Rinse BID Tyrone NineGrunz, Ryan B, MD   15 mL at 06/30/18 0951  . multivitamin with minerals tablet 1 tablet  1 tablet Oral Daily Leroy SeaSingh, Prashant K, MD   1 tablet at 06/30/18 0951  . OLANZapine (ZYPREXA) tablet 10 mg  10 mg  Oral QHS Tyrone NineGrunz, Ryan B, MD   10 mg at 06/29/18 2200  . ondansetron (ZOFRAN) tablet 4 mg  4 mg Oral Q6H PRN Haydee Monicaavid, Rachal A, MD       Or  . ondansetron (ZOFRAN) injection 4 mg  4 mg Intravenous Q6H PRN Haydee Monicaavid, Rachal A, MD      . pantoprazole (PROTONIX) EC tablet 40 mg  40 mg Oral Daily Leroy SeaSingh, Prashant K, MD   40 mg at 06/30/18 0951  . sodium chloride flush (NS) 0.9 % injection 3 mL  3 mL Intravenous Q12H Tarry Kosavid, Rachal A, MD   3 mL at 06/30/18 0951  . sodium chloride flush (NS) 0.9 % injection 3 mL  3 mL Intravenous PRN Tarry Kosavid, Rachal A, MD      . thiamine (VITAMIN B-1) tablet 100 mg  100 mg Oral Daily Leroy SeaSingh, Prashant K, MD   100 mg at 06/30/18 0951  . vitamin C (ASCORBIC ACID) tablet 500 mg  500 mg Oral Daily Tarry Kosavid, Rachal A, MD   500 mg at 06/30/18 0951  . zinc sulfate capsule 220 mg  220 mg Oral Daily Haydee Monicaavid, Rachal A, MD   220 mg at 06/30/18 [id number]     Discharge Medications: Please see discharge summary for a list of discharge medications.  Relevant Imaging Results:  Relevant Lab Results:   Additional Information SSN: [id number]  Gildardo GriffesAshley M Mcihael Hinderman, LCSW

## 2018-06-30 NOTE — Progress Notes (Signed)
CardioVascular Research Department and AHF Team  ReDS Research Project   Patient #: [id number]  ReDS Measurement  Right: 49 %  Left: 50 %

## 2021-01-09 IMAGING — CT CT ANGIOGRAPHY CHEST
1 of 4 series · 18 of 29 positions shown · IV contrast (OMNIPAQUE)
Comparison: None.

CLINICAL DATA: 63-year-old male with history of positive D-dimer.
Evaluate for pulmonary embolism. Known [id number]-RZ infection.

EXAM:
CT ANGIOGRAPHY CHEST WITH CONTRAST
TECHNIQUE: Multidetector CT imaging of the chest was performed using the
standard protocol during bolus administration of intravenous
contrast. Multiplanar CT image reconstructions and MIPs were
obtained to evaluate the vascular anatomy.
CONTRAST:  100mL OMNIPAQUE IOHEXOL 350 MG/ML SOLN

[Series 5: pe chest · axial · 0.67mm/px · z∈[-595,-331]mm · 18 of 144 slices shown]
[im 6/144  lung]
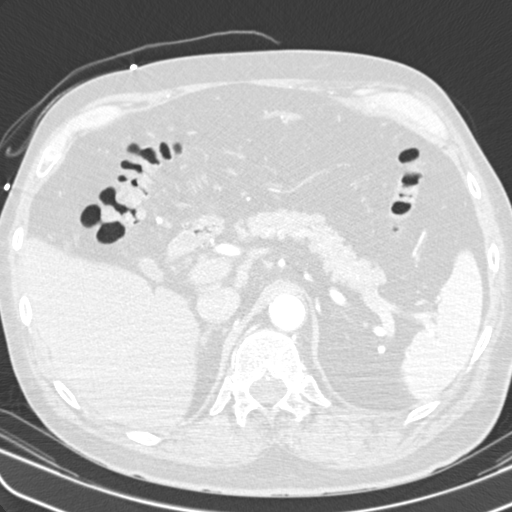
[im 18/144  mediastinal]
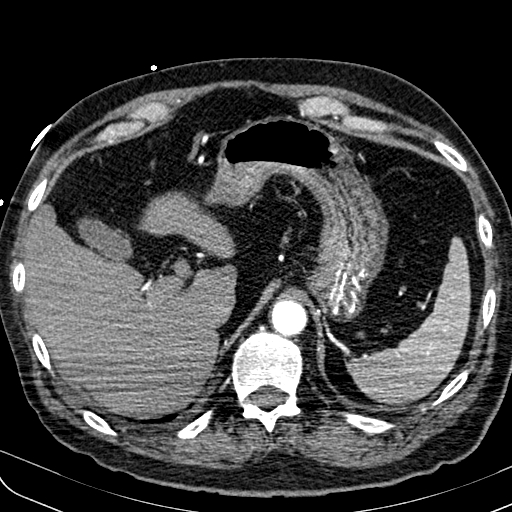
[im 24/144  lung]
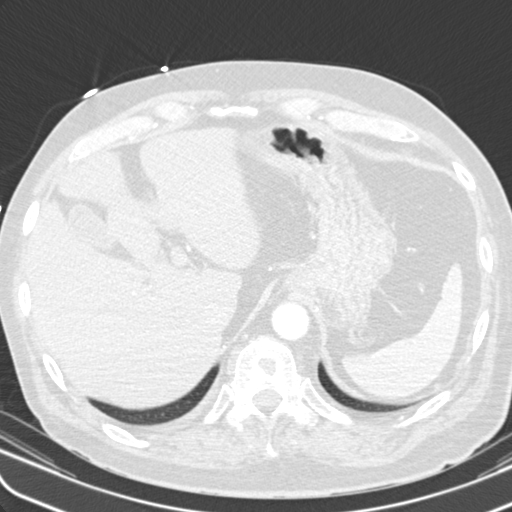
[im 30/144  mediastinal]
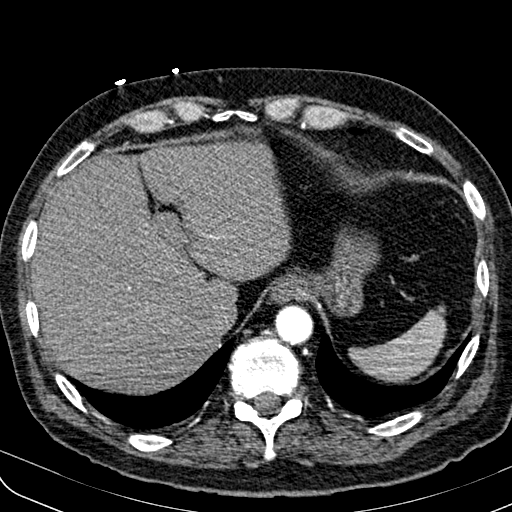
[im 42/144  lung]
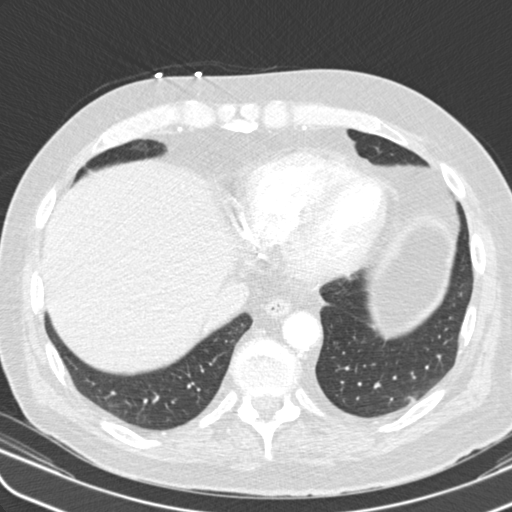
[im 48/144  mediastinal]
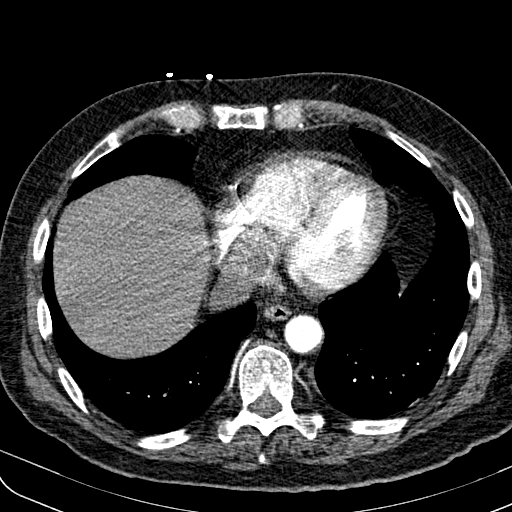
[im 54/144  lung]
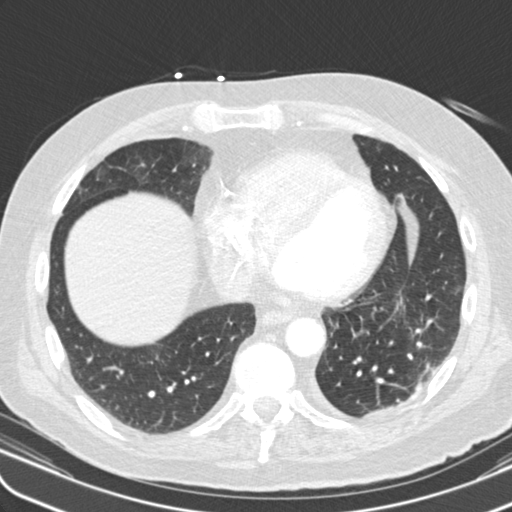
[im 66/144  mediastinal]
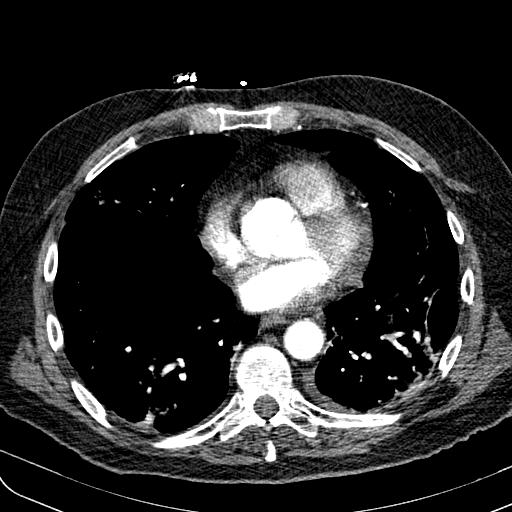
[im 72/144  lung]
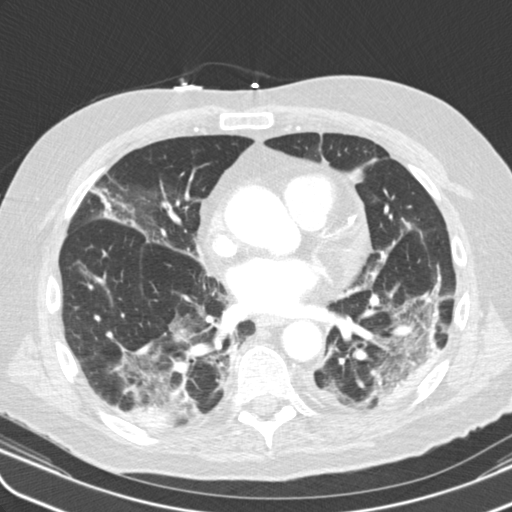
[im 75/144  mediastinal]
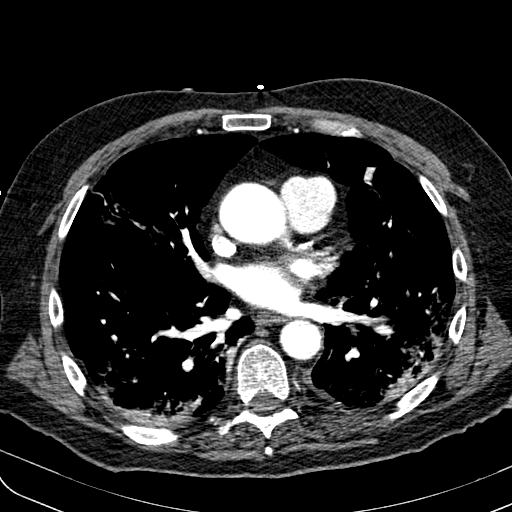
[im 84/144  lung]
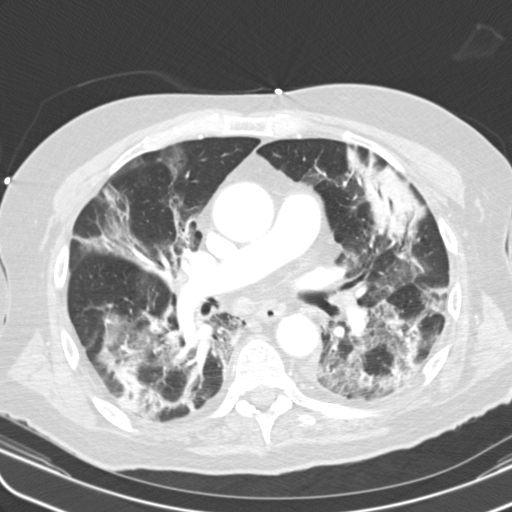
[im 90/144  mediastinal]
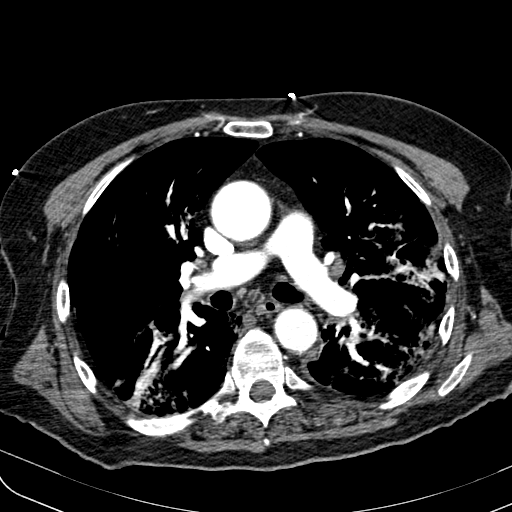
[im 96/144  lung]
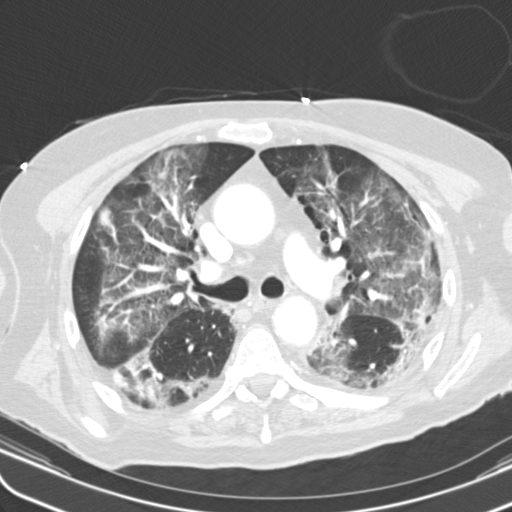
[im 108/144  mediastinal]
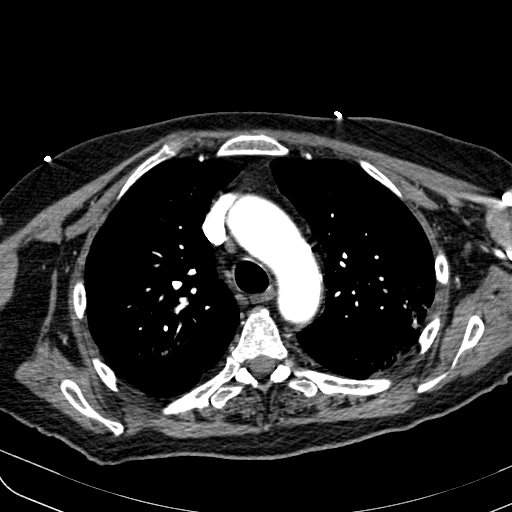
[im 114/144  lung]
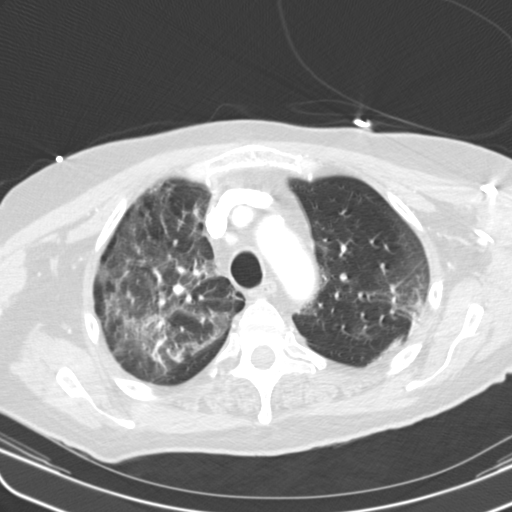
[im 120/144  mediastinal]
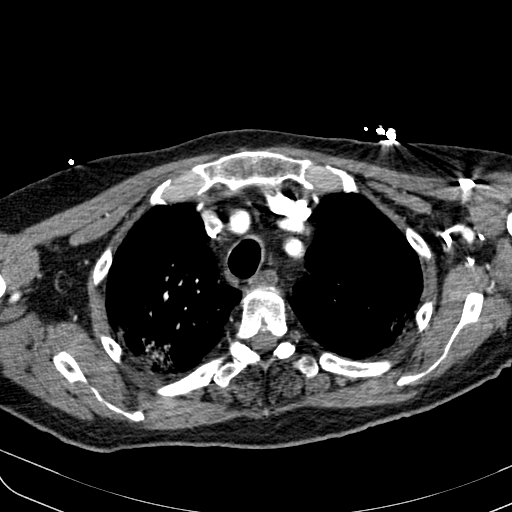
[im 132/144  lung]
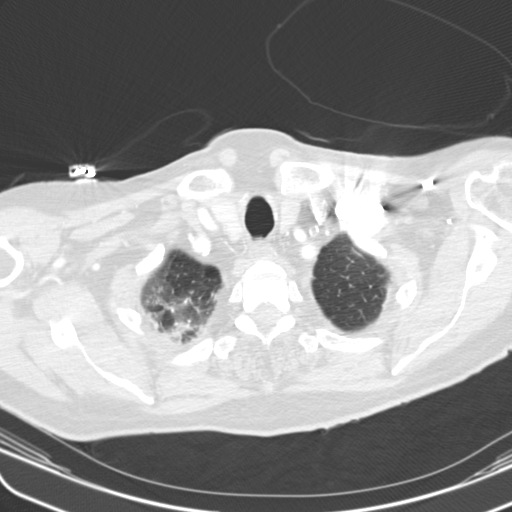
[im 138/144  mediastinal]
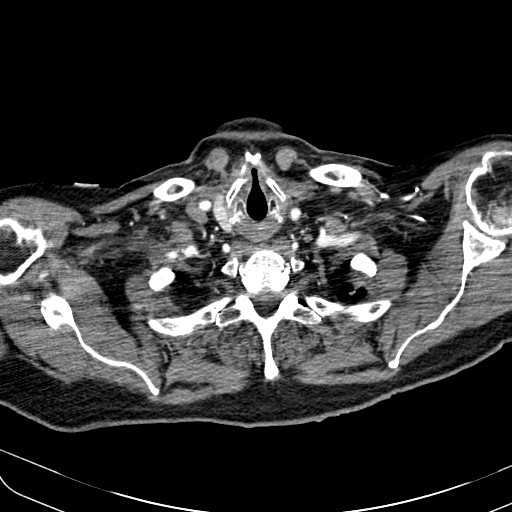

[18 of 29 positions shown; findings below may reference images not displayed]

FINDINGS: Cardiovascular: No filling defects within the pulmonary arterial
tree to suggest pulmonary embolism. Heart size is normal. There is
no significant pericardial fluid, thickening or pericardial
calcification. There is aortic atherosclerosis, as well as
atherosclerosis of the great vessels of the mediastinum and the
coronary arteries, including calcified atherosclerotic plaque in the
left main, left anterior descending, left circumflex and right
coronary arteries.

Mediastinum/Nodes: No pathologically enlarged mediastinal or hilar
lymph nodes. Esophagus is unremarkable in appearance. No axillary
lymphadenopathy.

Lungs/Pleura: Widespread patchy areas of ground-glass attenuation,
septal thickening, thickening of the peribronchovascular
interstitium, regional architectural distortion and a few scattered
areas of peripheral predominant consolidation are noted, most
evident throughout the mid to upper lungs bilaterally. Trace amount
of pleural fluid and/or thickening in the posteromedial aspect of
the left hemithorax. No right pleural effusion. No definite
suspicious appearing pulmonary nodules or masses.

Upper Abdomen: Unremarkable.

Musculoskeletal: There are no aggressive appearing lytic or blastic
lesions noted in the visualized portions of the skeleton.

Review of the MIP images confirms the above findings.
IMPRESSION: 1. No evidence of pulmonary embolism.
2. There is a spectrum of findings in the lungs compatible with late
stage pulmonary manifestations of [id number]-RZ infection, as described
above.
3. Trace amount of left-sided pleural fluid and/or thickening.
4. Aortic atherosclerosis, in addition to left main and 3 vessel
coronary artery disease. Please note that although the presence of
coronary artery calcium documents the presence of coronary artery
disease, the severity of this disease and any potential stenosis
cannot be assessed on this non-gated CT examination. Assessment for
potential risk factor modification, dietary therapy or pharmacologic
therapy may be warranted, if clinically indicated.

Aortic Atherosclerosis (O3KY0-H24.4).

## 2021-01-13 IMAGING — DX PORTABLE CHEST - 1 VIEW
1 series · 1 of 1 positions shown · non-contrast
Comparison: Chest x-ray 06/23/2018.

CLINICAL DATA: 63-year-old male with history of acute respiratory
disease secondary to 8MYM3-6P.

EXAM:
PORTABLE CHEST 1 VIEW

[chest]
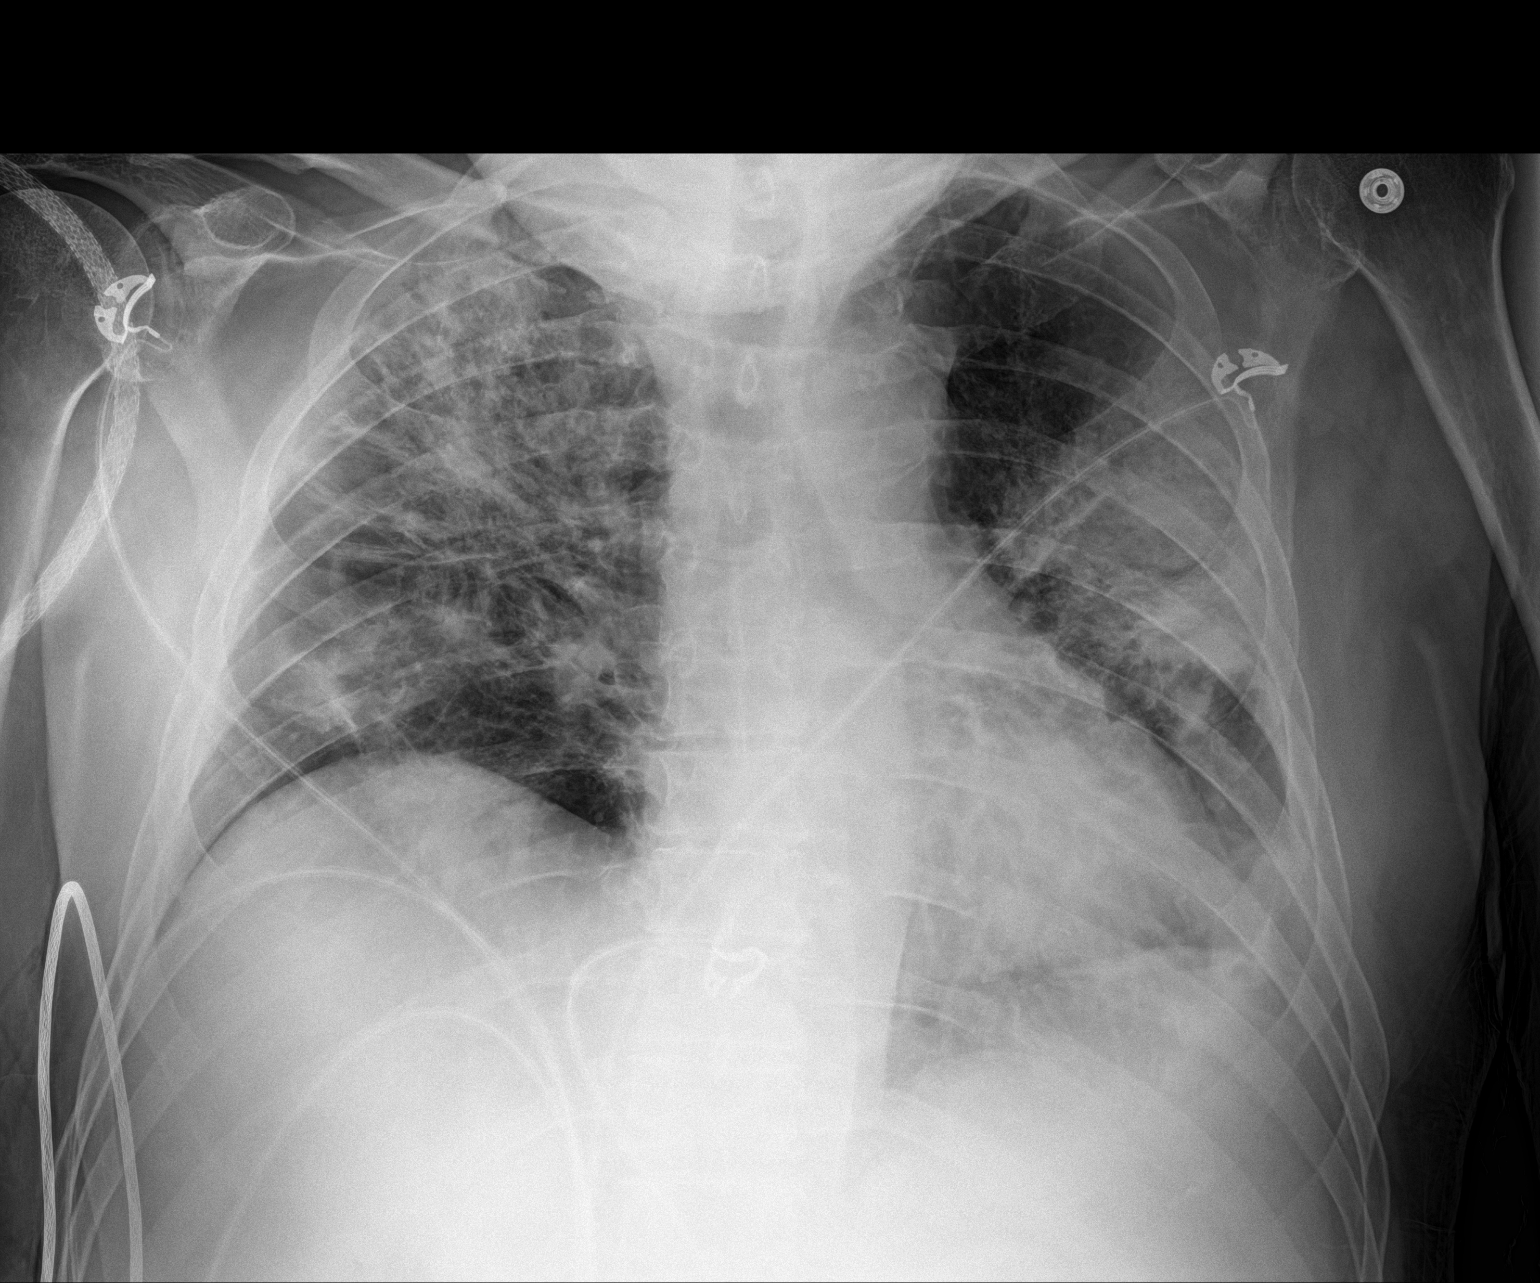

[1 of 1 positions shown; findings below may reference images not displayed]

FINDINGS: Lung volumes are low. There continues to be patchy multifocal
interstitial prominence and airspace consolidation asymmetrically
distributed throughout the lungs bilaterally, most confluent in the
left mid lung and in the right upper lobe, with worsening aeration
compared to the prior study from 06/23/2018. Possible small left
pleural effusion. No right pleural effusion. No pneumothorax. No
evidence of pulmonary edema. Heart size is normal. Upper mediastinal
contours are within normal limits. Aortic atherosclerosis.
IMPRESSION: 1. Worsening multilobar pneumonia, as above.
2. Possible small left pleural effusion.
3. Aortic atherosclerosis.
